# Patient Record
Sex: Male | Born: 1960 | Race: White | Marital: Married | Smoking: Never smoker
Health system: Northeastern US, Academic
[De-identification: ages and names within clinical notes are randomized; demographics above are authoritative.]

## PROBLEM LIST (undated history)

## (undated) HISTORY — PX: VASECTOMY: SHX75

## (undated) HISTORY — PX: HX TONSILLECTOMY/ADENOIDECTOMY: SHX292

---

## 2009-02-06 ENCOUNTER — Ambulatory Visit
Admit: 2009-02-06 | Discharge: 2009-02-06 | Disposition: A | Discharge status: Home / Self Care | Payer: Self-pay | Source: Ambulatory Visit | Attending: Primary Care | Admitting: Primary Care

## 2009-02-06 LAB — COMPREHENSIVE METABOLIC PANEL
ALT: 29 U/L (ref 0–50)
AST: 42 U/L (ref 0–50)
Albumin: 4.8 g/dL (ref 3.5–5.2)
Alk Phos: 67 U/L (ref 40–130)
Anion Gap: 9 (ref 7–16)
Bilirubin,Total: 0.4 mg/dL (ref 0.0–1.2)
CO2: 30 mmol/L — ABNORMAL HIGH (ref 20–28)
Calcium: 9.7 mg/dL (ref 8.6–10.2)
Chloride: 101 mmol/L (ref 96–108)
Creatinine: 0.93 mg/dL (ref 0.67–1.17)
GFR,Black: >59 *
GFR,Caucasian: >59 *
Glucose: 87 mg/dL (ref 74–106)
Lab: 17 mg/dL (ref 6–20)
Potassium: 4.3 mmol/L (ref 3.3–5.1)
Sodium: 140 mmol/L (ref 133–145)
Total Protein: 7.4 g/dL (ref 6.3–7.7)

## 2009-02-06 LAB — URINALYSIS WITH REFLEX TO MICROSCOPIC
Blood,UA: NEGATIVE
Ketones, UA: NEGATIVE
Leuk Esterase,UA: NEGATIVE
Nitrite,UA: NEGATIVE
Protein,UA: NEGATIVE mg/dL
Specific Gravity,UA: 1.01 (ref 1.002–1.030)
pH,UA: 7 (ref 5.0–8.0)

## 2009-02-06 LAB — CBC
Hematocrit: 47 % (ref 40–51)
Hemoglobin: 15.4 g/dL (ref 13.7–17.5)
MCV: 86 fL (ref 79–92)
Platelets: 228 THOU/uL (ref 150–330)
RBC: 5.5 MIL/uL (ref 4.6–6.1)
RDW: 13.3 % (ref 11.6–14.4)
WBC: 7 THOU/uL (ref 4.2–9.1)

## 2009-02-06 LAB — PSA: PSA (eff. 4-2010): 0.86 ng/mL (ref 0.00–4.00)

## 2009-02-06 LAB — LIPID PANEL
Chol/HDL Ratio: 3.7
Cholesterol: 256 mg/dL — AB
HDL: 70 mg/dL
LDL Calculated: 165 mg/dL
Non HDL Cholesterol: 186 mg/dL
Triglycerides: 107 mg/dL

## 2009-02-13 ENCOUNTER — Ambulatory Visit: Payer: Self-pay | Admitting: Primary Care

## 2009-02-13 ENCOUNTER — Encounter: Payer: Self-pay | Admitting: Gastroenterology

## 2009-02-13 NOTE — H&P (Signed)
 Reason For Visit   His only complaint are some right foot lesions.     Medical history remarkable for tonsillectomy vasectomy dysplastic nevi     Family history negative for colon and prostate cancer.  One grandparent had   diabetes.  Father died of suicide at age 51.  Brother has cerebral palsy   and hypertension.  No family history of colon polyps or aneurysm.  Mother's   sister had brain cancer.  Some skin cancers in a [place] relative.    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  Married, 2 children ages 71   and 67, never smoked, 2-3 alcoholic beverages per week.  Normally exercises   but has been on hiatus last 6 months.  Diet higher fat the past one to 2   weeks associated with holidays and son being home.     Medications none     Review of systems unremarkable     Blood pressure 128/84.  HEENT exam is benign.  Neck and thyroid normal.    Chest clear to PandA.  Heart regular without murmur.  Abdomen is benign.    Genital exam is normal.  Rectal exam shows a slightly enlarged prostate   right lobe larger than left which was also noted in 2006.  He has no   lymphadenopathy or edema.  He has a few dysplastic nevi on his back   principally which are dysplastic primarily because of size.  The 2 largest   ones on his back are nearly circular and the smallest of the 3 ovoid. has   some plantar warts on the right foot.  Musculoskeletal and neurologic exams   are normal.     EKG shows sinus rhythm.     Impression-  1. dysplastic nevi.  Again suggested taking a photograph for comparison   purposes  2.  Hyperlipidemia.  Lipids worse  than they were last time.  I recommended   consuming animal fat at only one meal per day and aerobic exercise 90-150   minutes per week.  Will have lipid profile after 3-6 months at his   discretion.  3.  Health maintenance.  Referred for colonoscopy Dr. Lucienne Minks.  Immunizations   up-to-date.  Had flu vaccine at work.   Blood sugar and PSA are normal.  4. plantar warts.  Discussed options.  asymptomatic, may observe.  Allergies   No Known Drug Allergy.  PSH   Surgery Of Male Genitalia Vasectomy (V25.2)  Tonsillectomy.  Immunizations   Td; 25 Jul 2003  Tdap (Adacel); 05 Jun 2006.  Vital Signs   Recorded by Neita Carp on 13 Feb 2009 07:37 AM  HR: 77 b/min  Recorded by [facility] on 13 Feb 2009 07:33 AM  BP:124/86,  RUE,  Sitting,   Height: 68 in, Weight: 178 lb, BMI: 27.1 kg/m2.  Signature   Electronically signed by: Aris Georgia  M.D.; 02/13/2009 8:40 AM EST.

## 2010-02-12 NOTE — Miscellaneous (Unsigned)
 Continuity of Care Record  Created: todo  From: Aris Georgia  From:   From: [employer], EHR v10.2.7.53  To: Rogus, Ajay  Purpose: Patient Use;       Alerts  Allergy - No Known Drug Allergy     Immunizations  Td   Tdap (Adacel)   Influenza

## 2011-03-20 ENCOUNTER — Encounter: Payer: Self-pay | Admitting: Primary Care

## 2011-03-20 ENCOUNTER — Ambulatory Visit: Payer: Self-pay | Admitting: Primary Care

## 2011-03-20 VITALS — BP 126/82 | Resp 14 | Ht 68.0 in | Wt 181.0 lb

## 2011-03-20 DIAGNOSIS — Z139 Encounter for screening, unspecified: Secondary | ICD-10-CM

## 2011-03-20 DIAGNOSIS — L989 Disorder of the skin and subcutaneous tissue, unspecified: Secondary | ICD-10-CM

## 2011-03-20 NOTE — Progress Notes (Signed)
For a couple of weeks he's had some bumps on his back and some itching to a small degree.  He has no pain.  On exam he has numerous benign-appearing lesions on his back including nevi seborrheic keratoses lentigines.    Impression  1. Skin lesions all benign   2. Xerodermatosis Causing itching. Discussed using lubrication  3.  Health maintenance.  Schedule colonoscopy [facility] this year.  Check fasting CMP and lipids sometime

## 2011-03-20 NOTE — Addendum Note (Signed)
Addended by: Aris Georgia B on: 03/20/2011 05:56 PM     Modules accepted: Orders

## 2011-09-23 ENCOUNTER — Encounter: Payer: Self-pay | Admitting: Gastroenterology

## 2011-09-23 LAB — HM COLONOSCOPY

## 2011-10-03 ENCOUNTER — Encounter: Payer: Self-pay | Admitting: Primary Care

## 2011-10-09 ENCOUNTER — Ambulatory Visit
Admit: 2011-10-09 | Discharge: 2011-10-09 | Disposition: A | Discharge status: Home / Self Care | Payer: Self-pay | Source: Ambulatory Visit | Attending: Primary Care | Admitting: Primary Care

## 2011-10-09 DIAGNOSIS — Z139 Encounter for screening, unspecified: Secondary | ICD-10-CM

## 2011-10-09 LAB — LIPID PANEL
Chol/HDL Ratio: 4.5
Cholesterol: 246 mg/dL — AB
HDL: 55 mg/dL
LDL Calculated: 170 mg/dL
Non HDL Cholesterol: 191 mg/dL
Triglycerides: 103 mg/dL

## 2011-10-09 LAB — COMPREHENSIVE METABOLIC PANEL
ALT: 42 U/L (ref 0–50)
AST: 38 U/L (ref 0–50)
Albumin: 4.7 g/dL (ref 3.5–5.2)
Alk Phos: 73 U/L (ref 40–130)
Anion Gap: 11 (ref 7–16)
Bilirubin,Total: 0.3 mg/dL (ref 0.0–1.2)
CO2: 27 mmol/L (ref 20–28)
Calcium: 9.3 mg/dL (ref 8.6–10.2)
Chloride: 104 mmol/L (ref 96–108)
Creatinine: 1.31 mg/dL — ABNORMAL HIGH (ref 0.67–1.17)
GFR,Black: 71 *
GFR,Caucasian: 62 *
Glucose: 102 mg/dL — ABNORMAL HIGH (ref 60–99)
Lab: 20 mg/dL (ref 6–20)
Potassium: 4.2 mmol/L (ref 3.3–5.1)
Sodium: 142 mmol/L (ref 133–145)
Total Protein: 7 g/dL (ref 6.3–7.7)

## 2011-10-11 ENCOUNTER — Encounter: Payer: Self-pay | Admitting: Primary Care

## 2011-10-11 ENCOUNTER — Other Ambulatory Visit: Payer: Self-pay | Admitting: Primary Care

## 2011-10-11 DIAGNOSIS — R7989 Other specified abnormal findings of blood chemistry: Secondary | ICD-10-CM

## 2011-10-11 DIAGNOSIS — E785 Hyperlipidemia, unspecified: Secondary | ICD-10-CM

## 2011-10-17 ENCOUNTER — Telehealth: Payer: Self-pay | Admitting: Primary Care

## 2011-10-17 NOTE — Telephone Encounter (Signed)
Sometime in the next month.  Order for blood and urine at the laboratory already

## 2011-10-17 NOTE — Telephone Encounter (Signed)
Patient received his lab test results - wants to know when you wanted his creatine checked and if lab slip has been put in the system.

## 2011-10-17 NOTE — Telephone Encounter (Signed)
Message left as per Dr. Vukman

## 2012-02-24 ENCOUNTER — Ambulatory Visit
Admit: 2012-02-24 | Discharge: 2012-02-24 | Disposition: A | Discharge status: Home / Self Care | Payer: Self-pay | Source: Ambulatory Visit | Attending: Primary Care | Admitting: Primary Care

## 2012-02-24 DIAGNOSIS — R7989 Other specified abnormal findings of blood chemistry: Secondary | ICD-10-CM

## 2012-02-24 LAB — BASIC METABOLIC PANEL
Anion Gap: 9 (ref 7–16)
CO2: 29 mmol/L — ABNORMAL HIGH (ref 20–28)
Calcium: 9 mg/dL (ref 8.6–10.2)
Chloride: 102 mmol/L (ref 96–108)
Creatinine: 1.08 mg/dL (ref 0.67–1.17)
GFR,Black: 90 *
GFR,Caucasian: 78 *
Glucose: 101 mg/dL — ABNORMAL HIGH (ref 60–99)
Lab: 14 mg/dL (ref 6–20)
Potassium: 4.2 mmol/L (ref 3.3–5.1)
Sodium: 140 mmol/L (ref 133–145)

## 2012-02-24 LAB — URINALYSIS WITH REFLEX TO MICROSCOPIC
Blood,UA: NEGATIVE
Ketones, UA: NEGATIVE
Leuk Esterase,UA: NEGATIVE
Nitrite,UA: NEGATIVE
Protein,UA: NEGATIVE mg/dL
Specific Gravity,UA: 1.024 (ref 1.002–1.030)
pH,UA: 5 (ref 5.0–8.0)

## 2012-02-24 LAB — TSH: TSH: 1.91 u[IU]/mL (ref 0.27–4.20)

## 2012-02-28 ENCOUNTER — Encounter: Payer: Self-pay | Admitting: Primary Care

## 2013-11-16 ENCOUNTER — Ambulatory Visit
Admit: 2013-11-16 | Discharge: 2013-11-16 | Disposition: A | Discharge status: Home / Self Care | Payer: Self-pay | Source: Ambulatory Visit | Attending: Primary Care | Admitting: Primary Care

## 2013-11-16 ENCOUNTER — Telehealth: Payer: Self-pay | Admitting: Primary Care

## 2013-11-16 DIAGNOSIS — Z139 Encounter for screening, unspecified: Secondary | ICD-10-CM

## 2013-11-16 LAB — URINALYSIS WITH REFLEX TO MICROSCOPIC
Blood,UA: NEGATIVE
Ketones, UA: NEGATIVE
Leuk Esterase,UA: NEGATIVE
Nitrite,UA: NEGATIVE
Protein,UA: NEGATIVE mg/dL
Specific Gravity,UA: 1.02 (ref 1.002–1.030)
pH,UA: 6 (ref 5.0–8.0)

## 2013-11-16 LAB — COMPREHENSIVE METABOLIC PANEL
ALT: 34 U/L (ref 0–50)
AST: 34 U/L (ref 0–50)
Albumin: 4.4 g/dL (ref 3.5–5.2)
Alk Phos: 64 U/L (ref 40–130)
Anion Gap: 14 (ref 7–16)
Bilirubin,Total: 0.4 mg/dL (ref 0.0–1.2)
CO2: 26 mmol/L (ref 20–28)
Calcium: 9.3 mg/dL (ref 8.6–10.2)
Chloride: 101 mmol/L (ref 96–108)
Creatinine: 1.11 mg/dL (ref 0.67–1.17)
GFR,Black: 86 *
GFR,Caucasian: 74 *
Glucose: 108 mg/dL — ABNORMAL HIGH (ref 60–99)
Lab: 16 mg/dL (ref 6–20)
Potassium: 4.7 mmol/L (ref 3.3–5.1)
Sodium: 141 mmol/L (ref 133–145)
Total Protein: 6.9 g/dL (ref 6.3–7.7)

## 2013-11-16 LAB — CBC
Hematocrit: 46 % (ref 40–51)
Hemoglobin: 14.6 g/dL (ref 13.7–17.5)
MCH: 27 pg/cell (ref 26–32)
MCHC: 32 g/dL (ref 32–37)
MCV: 86 fL (ref 79–92)
Platelets: 218 10*3/uL (ref 150–330)
RBC: 5.4 MIL/uL (ref 4.6–6.1)
RDW: 14.6 % — ABNORMAL HIGH (ref 11.6–14.4)
WBC: 6.1 10*3/uL (ref 4.2–9.1)

## 2013-11-16 LAB — LIPID PANEL
Chol/HDL Ratio: 3.5
Cholesterol: 234 mg/dL — AB
HDL: 67 mg/dL
LDL Calculated: 152 mg/dL
Non HDL Cholesterol: 167 mg/dL
Triglycerides: 73 mg/dL

## 2013-11-16 NOTE — Telephone Encounter (Signed)
Patient is currently at the lab, has appointment on 11/22/13

## 2013-11-22 ENCOUNTER — Ambulatory Visit: Payer: Self-pay | Admitting: Primary Care

## 2013-11-22 ENCOUNTER — Encounter: Payer: Self-pay | Admitting: Primary Care

## 2013-11-22 VITALS — BP 134/72 | HR 80 | Ht 68.0 in | Wt 186.0 lb

## 2013-11-22 DIAGNOSIS — Z0000 Encounter for general adult medical examination without abnormal findings: Secondary | ICD-10-CM

## 2013-11-22 NOTE — Progress Notes (Signed)
He had some intermittent dizziness described as swaying being off balance on and off during 1 day.  No nausea palpitation tinnitus hearing change       Medical history remarkable for tonsillectomy vasectomy dysplastic nevi    Family history reviewed negative for colon and prostate cancer. One grandparent had diabetes. Father died of suicide at age 139. Brother has cerebral palsy and hypertension. No family history of colon polyps or aneurysm. Mother's sister had brain cancer. [place] skin cancers in [place]. 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  Walking regularly 3-4 times per week but no aerobic exercise     Medications none    Review of systems unremarkable    BP 134/72 mmHg   Pulse 80   Ht 1.727 m (5\' 8" )   Wt 84.369 kg (186 lb)   BMI 28.29 kg/m2  HEENT exam is normal.  Neck and thyroid normal.  Chest clear to P&A.  Heart regular without murmur.  Abdomen without tenderness mass organomegaly.  Genital exam is normal.  Circumcised.  No hernia.  Rectal exam shows normal size prostate without induration or nodularity.  He has some dysplastic nevi seborrheic keratoses.  He has no lymphadenopathy or edema.  Neurologic musculoskeletal exams are normal.  Pedal pulses 2+.  Romberg negative.  Heel toe tandem gait done very well.      Impression-  1. dizziness, most likely labyrinthine in origin.  Discussed.  Observe.    2. Hyperlipidemia.  Discussed, 10 year Framingham risk 7%.  Offered statin, however, 150 minutes of aerobic exercise weekly would do just as well.  3. Health maintenance.  Blood sugar normal.  Given flu vaccine.

## 2015-06-14 ENCOUNTER — Encounter: Payer: Self-pay | Admitting: Primary Care

## 2015-06-14 ENCOUNTER — Ambulatory Visit: Payer: Self-pay | Admitting: Primary Care

## 2015-06-14 VITALS — BP 142/80 | HR 74 | Ht 68.0 in | Wt 185.7 lb

## 2015-06-14 DIAGNOSIS — M722 Plantar fascial fibromatosis: Secondary | ICD-10-CM

## 2015-06-14 DIAGNOSIS — Z0000 Encounter for general adult medical examination without abnormal findings: Secondary | ICD-10-CM

## 2015-06-14 MED ORDER — PREDNISONE 20 MG PO TABS *I*
20.0000 mg | ORAL_TABLET | Freq: Every day | ORAL | 0 refills | Status: DC
Start: 2015-06-14 — End: 2015-11-06

## 2015-06-14 NOTE — Progress Notes (Signed)
He complains of 6 weeks of right heel pain without injury overuse unresponsive to ibuprofen worse with weightbearing without numbness paresthesias.  Visit Vitals    BP 142/80 (BP Location: Right arm, Patient Position: Sitting, Cuff Size: large adult)    Pulse 74    Ht 1.727 m (5\' 8" )    Wt 84.2 kg (185 lb 11.2 oz)    SpO2 98%    BMI 28.24 kg/m2     On exam passive ranging of his ankle and subtalar joints is normal as are isometrics.  He has no Achilles tenderness.  He does have calcaneal tenderness.    Impression plantar fasciitis.  Recommend prednisone 20 mg daily for no more than 2 weeks, stretches, given written instructions, Tulli's heel cup.  If no resolution call for referral    Recheck later this year physical laboratory

## 2015-10-30 ENCOUNTER — Other Ambulatory Visit
Admission: RE | Admit: 2015-10-30 | Discharge: 2015-10-30 | Disposition: A | Discharge status: Home / Self Care | Payer: Self-pay | Source: Ambulatory Visit | Attending: Primary Care | Admitting: Primary Care

## 2015-10-30 DIAGNOSIS — Z0000 Encounter for general adult medical examination without abnormal findings: Secondary | ICD-10-CM

## 2015-10-30 LAB — LIPID PANEL
Chol/HDL Ratio: 4
Cholesterol: 232 mg/dL — AB
HDL: 58 mg/dL
LDL Calculated: 146 mg/dL
Non HDL Cholesterol: 174 mg/dL
Triglycerides: 138 mg/dL

## 2015-10-30 LAB — COMPREHENSIVE METABOLIC PANEL
ALT: 33 U/L (ref 0–50)
AST: 39 U/L (ref 0–50)
Albumin: 4.4 g/dL (ref 3.5–5.2)
Alk Phos: 62 U/L (ref 40–130)
Anion Gap: 13 (ref 7–16)
Bilirubin,Total: 0.6 mg/dL (ref 0.0–1.2)
CO2: 29 mmol/L — ABNORMAL HIGH (ref 20–28)
Calcium: 9.3 mg/dL (ref 8.6–10.2)
Chloride: 100 mmol/L (ref 96–108)
Creatinine: 1.12 mg/dL (ref 0.67–1.17)
GFR,Black: 84 *
GFR,Caucasian: 72 *
Glucose: 100 mg/dL — ABNORMAL HIGH (ref 60–99)
Lab: 17 mg/dL (ref 6–20)
Potassium: 4.1 mmol/L (ref 3.3–5.1)
Sodium: 142 mmol/L (ref 133–145)
Total Protein: 6.6 g/dL (ref 6.3–7.7)

## 2015-10-30 LAB — URINALYSIS WITH REFLEX TO MICROSCOPIC
Blood,UA: NEGATIVE
Ketones, UA: NEGATIVE
Leuk Esterase,UA: NEGATIVE
Nitrite,UA: NEGATIVE
Protein,UA: NEGATIVE mg/dL
Specific Gravity,UA: 1.017 (ref 1.002–1.030)
pH,UA: 7 (ref 5.0–8.0)

## 2015-10-30 LAB — CBC
Hematocrit: 45 % (ref 40–51)
Hemoglobin: 15.1 g/dL (ref 13.7–17.5)
MCH: 28 pg/cell (ref 26–32)
MCHC: 33 g/dL (ref 32–37)
MCV: 84 fL (ref 79–92)
Platelets: 199 10*3/uL (ref 150–330)
RBC: 5.4 MIL/uL (ref 4.6–6.1)
RDW: 12.8 % (ref 11.6–14.4)
WBC: 5.1 10*3/uL (ref 4.2–9.1)

## 2015-10-30 LAB — HEMOGLOBIN A1C: Hemoglobin A1C: 5.9 % (ref 4.0–6.0)

## 2015-11-06 ENCOUNTER — Ambulatory Visit: Payer: Self-pay | Admitting: Primary Care

## 2015-11-06 ENCOUNTER — Encounter: Payer: Self-pay | Admitting: Primary Care

## 2015-11-06 VITALS — BP 140/86 | HR 78 | Ht 68.0 in | Wt 187.9 lb

## 2015-11-06 DIAGNOSIS — Z0000 Encounter for general adult medical examination without abnormal findings: Secondary | ICD-10-CM

## 2015-11-06 DIAGNOSIS — Z23 Encounter for immunization: Secondary | ICD-10-CM

## 2015-11-06 LAB — HM HIV SCREENING OFFERED

## 2015-11-06 LAB — PCMH DEPRESSION ASSESSMENT

## 2015-11-06 NOTE — Progress Notes (Signed)
His plantar fasciitis is much better but not completely resolved.  Doing stretches.  Symptoms primarily limited to the morning.     Medical history remarkable for tonsillectomy vasectomy dysplastic nevi    Family history reviewed negative for colon and prostate cancer. One grandparent had diabetes. Father died of suicide at age 57. Brother has cerebral palsy and hypertension. No family history of colon polyps or aneurysm. Mother's sister had brain cancer. Some skin cancers in [place]. 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 Married, 2 children grown, never smoked, 4 alcoholic beverages per week.  Walking vigorously 40 minutes 4-5 times per week     Medications none    Review of systems unremarkable, depression screening negative. phq2 score is 0    BP 140/86   Pulse 78   Ht 1.727 m (5\' 8" )   Wt 85.2 kg (187 lb 14.4 oz)   BMI 28.57 kg/m2  blood pressure 134/74    HEENT exam is normal.  Neck and thyroid normal.  Chest clear to P&A.  Heart regular without murmur.  Abdomen without tenderness mass organomegaly.  Genital exam is normal.  Circumcised.  No hernia.  Rectal exam shows normal size prostate without induration or nodularity.  He has some dysplastic nevi and seborrheic keratoses.  He has no lymphadenopathy or edema.  Neurologic and musculoskeletal exams are normal.  Pedal pulses 2+.      Impression-  Dysplastic nevi, suggested taking pictures of three largest on back for visual reference    Hyperlipidemia, mild, medication not indicated.  Continue exercise  Health maintenance.   immunizations up-to-date.  Blood sugar normal.  Tdap and colonoscopy due 2018.  Has given blood multiple times in the past several years with [facility] therefore does not have HIV or hepatitis C  Plantar fasciitis    Recheck 1-2 years

## 2016-04-28 ENCOUNTER — Ambulatory Visit: Payer: BLUE CROSS/BLUE SHIELD | Attending: Primary Care | Admitting: Primary Care

## 2016-04-28 ENCOUNTER — Encounter: Payer: Self-pay | Admitting: Primary Care

## 2016-04-28 VITALS — BP 128/70 | HR 82 | Ht 69.0 in | Wt 187.0 lb

## 2016-04-28 DIAGNOSIS — L0390 Cellulitis, unspecified: Secondary | ICD-10-CM

## 2016-04-28 MED ORDER — CEPHALEXIN 250 MG PO CAPS *I*
250.0000 mg | ORAL_CAPSULE | Freq: Four times a day (QID) | ORAL | 0 refills | Status: DC
Start: 2016-04-28 — End: 2016-04-28

## 2016-04-28 MED ORDER — CEPHALEXIN 250 MG PO CAPS *I*
250.0000 mg | ORAL_CAPSULE | Freq: Four times a day (QID) | ORAL | 0 refills | Status: DC
Start: 2016-04-28 — End: 2016-07-23

## 2016-04-28 NOTE — Progress Notes (Signed)
He complains of some left eye swelling and discomfort.  Began yesterday.  No fever.  Denies itching.  Denies discharge.  BP 128/70   Pulse 82   Ht 1.753 m (5\' 9" )   Wt 84.8 kg (187 lb)   SpO2 98%   BMI 27.62 kg/m2  On exam he has no conjunctival injection.  Margin of his left upper eyelid is edematous and mildly red.  There is no evidence of stye.  He has no conjunctival discharge.    Impression cellulitis.  Scribed Keflex.

## 2016-07-23 ENCOUNTER — Encounter: Payer: Self-pay | Admitting: Primary Care

## 2016-07-23 ENCOUNTER — Ambulatory Visit: Payer: BLUE CROSS/BLUE SHIELD | Attending: Primary Care | Admitting: Primary Care

## 2016-07-23 VITALS — BP 136/82 | HR 84 | Temp 99.4°F | Ht 69.0 in | Wt 188.0 lb

## 2016-07-23 DIAGNOSIS — J029 Acute pharyngitis, unspecified: Secondary | ICD-10-CM

## 2016-07-23 MED ORDER — AZITHROMYCIN 250 MG PO TABS *I*
ORAL_TABLET | ORAL | 0 refills | Status: AC
Start: 2016-07-23 — End: 2016-07-28

## 2016-07-26 ENCOUNTER — Encounter: Payer: Self-pay | Admitting: Primary Care

## 2016-07-26 NOTE — Progress Notes (Signed)
Patient Dr. Sharon MtVukman presents with a worsening sore throat over the past several days, associated with some bilateral ear fullness.  Is not describing much in the way of cough.  No dyspnea is noted.  Is not describing any constitutional symptoms such as fevers or chills.  No GI symptoms such as diarrhea nor nausea are noted.    Physical exam-BP was 166/88 initially, 136/82 on recheck, temperature 99.4 degrees, looked well, ears were normal, throat was reddened bilaterally, neck was without adenopathy and lungs were clear, cardiac exam was regular with no murmurs.    Assessment/plan    Pharyngitis-appears bacterial, started on azithromycin with the generic Z-Pak.

## 2016-08-01 ENCOUNTER — Telehealth: Payer: Self-pay | Admitting: Primary Care

## 2016-08-01 MED ORDER — AMOXICILLIN-POT CLAVULANATE 875-125 MG PO TABS *I*
ORAL_TABLET | ORAL | 0 refills | Status: DC
Start: 2016-08-01 — End: 2017-11-13

## 2016-08-01 NOTE — Telephone Encounter (Signed)
Patient states that he saw Dr. Hetty BlendBuckley on 07/23/16. States Dr. Hetty BlendBuckley prescribed him azithromycin (ZITHROMAX) 250 MG tablet and medication did not work. Patient requesting another medication. Please advise.

## 2016-08-01 NOTE — Telephone Encounter (Signed)
Patient did not fully clear with a azithromycin, will try alternative with Augmentin 875 twice daily for 7 days.

## 2016-08-01 NOTE — Telephone Encounter (Addendum)
Patient was seen on 07/23/16 for pharyngitis, prescribed Z-Pak. Sore throat gone and cough improved, but cough returned. Cough is mostly bothersome in the AM, coughing up green-yellowish phlegm. Also has ear congestion, popping and nose is running.  No n/v, no diarrhea/constipation, no fever/chills  Asking for a refill on antibiotic or a different antibiotic.

## 2017-01-09 ENCOUNTER — Encounter: Payer: Self-pay | Admitting: Primary Care

## 2017-01-09 ENCOUNTER — Encounter: Payer: Self-pay | Admitting: Gastroenterology

## 2017-01-09 LAB — HM COLONOSCOPY

## 2017-07-28 ENCOUNTER — Other Ambulatory Visit: Payer: Self-pay | Admitting: Primary Care

## 2017-07-28 ENCOUNTER — Encounter: Payer: Self-pay | Admitting: Primary Care

## 2017-07-28 ENCOUNTER — Other Ambulatory Visit
Admission: RE | Admit: 2017-07-28 | Discharge: 2017-07-28 | Disposition: A | Discharge status: Home / Self Care | Payer: BLUE CROSS/BLUE SHIELD | Source: Ambulatory Visit | Attending: Primary Care | Admitting: Primary Care

## 2017-07-28 ENCOUNTER — Ambulatory Visit: Payer: BLUE CROSS/BLUE SHIELD | Attending: Primary Care | Admitting: Primary Care

## 2017-07-28 VITALS — BP 120/84 | HR 78 | Ht 69.0 in | Wt 192.1 lb

## 2017-07-28 DIAGNOSIS — M109 Gout, unspecified: Secondary | ICD-10-CM

## 2017-07-28 LAB — URIC ACID: Urate: 7.5 mg/dL (ref 3.9–9.0)

## 2017-07-28 MED ORDER — PREDNISONE 20 MG PO TABS *I*
20.0000 mg | ORAL_TABLET | Freq: Every day | ORAL | 0 refills | Status: DC
Start: 2017-07-28 — End: 2017-07-28

## 2017-07-28 MED ORDER — COLCHICINE-PROBENECID 0.5-500 MG PO TABS *A*
ORAL_TABLET | ORAL | 0 refills | Status: DC
Start: 2017-07-28 — End: 2017-11-13

## 2017-07-28 MED ORDER — PREDNISONE 20 MG PO TABS *I*
20.0000 mg | ORAL_TABLET | Freq: Every day | ORAL | 0 refills | Status: DC
Start: 2017-07-28 — End: 2017-11-13

## 2017-07-28 MED ORDER — COLCHICINE-PROBENECID 0.5-500 MG PO TABS *A*
ORAL_TABLET | ORAL | 0 refills | Status: DC
Start: 2017-07-28 — End: 2017-07-28

## 2017-07-28 NOTE — Progress Notes (Signed)
He developed pain in his right foot 2 days ago in the wake of having 2 maybe 3 beers over the weekend.  Aleve PM helps some.  Had similar episode some years ago.  No family history of gout.  No fever.  On exam passive ranging of his right first MTP joint causes pain.  There is some swelling and tenderness and redness over the joint and the adjacent dorsum of the foot.  The erythema over the dorsal foot is not tender to light palpation.    Impression right foot pain most likely due to gout.  Check uric acid level.  He will try taking colchicine and after 8-10 hours if not substantially better may try some prednisone.  He may still use some over-the-counter naproxen or ibuprofen for pain relief if necessary

## 2017-07-28 NOTE — Telephone Encounter (Signed)
Prescriptions were originally sent to [facility].  One of the scripts medication was unavailable.   [facility] [address]

## 2017-07-28 NOTE — Telephone Encounter (Signed)
Orders pended, pharmacy changed.  Medications at [facility], cancelled.

## 2017-07-29 ENCOUNTER — Encounter: Payer: Self-pay | Admitting: Primary Care

## 2017-11-13 ENCOUNTER — Ambulatory Visit: Payer: BLUE CROSS/BLUE SHIELD | Attending: Primary Care | Admitting: Primary Care

## 2017-11-13 ENCOUNTER — Encounter: Payer: Self-pay | Admitting: Primary Care

## 2017-11-13 VITALS — BP 136/68 | HR 96 | Temp 102.4°F | Ht 69.0 in | Wt 192.0 lb

## 2017-11-13 DIAGNOSIS — B349 Viral infection, unspecified: Secondary | ICD-10-CM

## 2017-11-13 LAB — PCMH DEPRESSION ASSESSMENT

## 2017-11-14 ENCOUNTER — Encounter: Payer: Self-pay | Admitting: Primary Care

## 2017-11-14 NOTE — Progress Notes (Signed)
Patient of Dr. Sharon Mt presents with 2 days of fevers chills with body aches, not describing cough or sputum production, no earache sore throat with sinus pressure.  He had some mild nausea but no vomiting, no diarrhea.  No abdominal cramps noted.  No urinary symptoms are noted.  He had received influenza vaccine the day prior to the onset of symptoms.    Physical exam-BP was 136/68, pulse of 76 and regular, temperature 102.4 degrees, he looked well, ears were normal, throat was clear, neck was without adenopathy and lungs were clear, abdomen was soft and nontender with no organomegaly normal bowel sounds and no masses.    Assessment/plan    Viral syndrome-likely explanation for this constellation symptoms, advised ibuprofen or acetaminophen as needed.  I do not feel is related to influenza vaccine.

## 2018-08-19 ENCOUNTER — Other Ambulatory Visit: Payer: Self-pay | Admitting: Primary Care

## 2018-08-19 MED ORDER — PREDNISONE 20 MG PO TABS *I*
20.0000 mg | ORAL_TABLET | Freq: Every day | ORAL | 0 refills | Status: DC
Start: 2018-08-19 — End: 2019-07-07

## 2018-08-19 MED ORDER — COLCHICINE-PROBENECID 0.5-500 MG PO TABS *A*
ORAL_TABLET | ORAL | 0 refills | Status: DC
Start: 2018-08-19 — End: 2019-07-07

## 2018-08-19 NOTE — Telephone Encounter (Signed)
He was prescribed these 2 medications  Last year for gout  He had some refills that he used when he had flare ups  He would like these rx's renewed as he is having a flare up   He would like to be notified   Pharmacy is [facility] in [place]

## 2018-12-23 ENCOUNTER — Ambulatory Visit: Payer: BLUE CROSS/BLUE SHIELD | Admitting: Primary Care

## 2018-12-23 ENCOUNTER — Encounter: Payer: Self-pay | Admitting: Primary Care

## 2018-12-23 ENCOUNTER — Other Ambulatory Visit
Admission: RE | Admit: 2018-12-23 | Discharge: 2018-12-23 | Disposition: A | Discharge status: Home / Self Care | Payer: BLUE CROSS/BLUE SHIELD | Source: Ambulatory Visit | Attending: Primary Care | Admitting: Primary Care

## 2018-12-23 VITALS — BP 142/88 | HR 88 | Ht 69.0 in | Wt 189.2 lb

## 2018-12-23 DIAGNOSIS — R1011 Right upper quadrant pain: Secondary | ICD-10-CM

## 2018-12-23 LAB — CBC
Hematocrit: 46 % (ref 40–51)
Hemoglobin: 14.9 g/dL (ref 13.7–17.5)
MCH: 28 pg/cell (ref 26–32)
MCHC: 32 g/dL (ref 32–37)
MCV: 86 fL (ref 79–92)
Platelets: 206 10*3/uL (ref 150–330)
RBC: 5.4 MIL/uL (ref 4.6–6.1)
RDW: 12.7 % (ref 11.6–14.4)
WBC: 7.2 10*3/uL (ref 4.2–9.1)

## 2018-12-23 LAB — URINALYSIS WITH REFLEX TO MICROSCOPIC
Glucose,UA: NEGATIVE mg/dL
Leuk Esterase,UA: NEGATIVE
Nitrite,UA: NEGATIVE
Specific Gravity,UA: 1.02 (ref 1.002–1.030)
pH,UA: 7 (ref 5.0–8.0)

## 2018-12-23 LAB — URINE MICROSCOPIC (IQ200)
Bacteria,UA: NONE SEEN
Hyaline Casts,UA: NONE SEEN /lpf (ref 0–5)
RBC,UA: >50 /hpf — AB (ref 0–3)

## 2018-12-23 LAB — PCMH DEPRESSION ASSESSMENT

## 2018-12-23 LAB — LIPASE: Lipase: 53 U/L (ref 13–60)

## 2018-12-23 LAB — AMYLASE: Amylase: 67 U/L (ref 28–100)

## 2018-12-23 NOTE — Progress Notes (Signed)
Complains of right upper quadrant pain that began 3 days ago.  Sometimes pain is felt posteriorly as well but not in right shoulder.  Not continuous, episodes are provoked by eating typically 45-60 minutes later.  Severity such that it made it difficult to sleep at times.  Admits to mild nausea, denies emesis.  Has some sign of some chills but no sweats.  Feels washed out.  Bowels are looser.  Has no urinary symptoms.  Urination does not aggravate pain.  Medications reviewed and listed below  BP 142/88    Pulse 88    Ht 1.753 m (5\' 9" )    Wt 85.8 kg (189 lb 3.2 oz)    BMI 27.94 kg/m   On exam he has no abdominal tenderness mass or organomegaly.  No pallor or jaundice.    Impression  Right upper quadrant pain, suspect cholecystitis.  He will have laboratory work as quickly as possible after his next episode of pain.  Recommended low/no fat diet.  Obtain abdominal ultrasound.     Current Outpatient Medications   Medication Sig    colchicine-probenecid (COLBENEMID) 0.5-500 MG per tablet Take two tablets then one two hours later    predniSONE (DELTASONE) 20 MG tablet Take 1 tablet (20 mg total) by mouth daily

## 2018-12-24 ENCOUNTER — Encounter: Payer: Self-pay | Admitting: Primary Care

## 2018-12-24 LAB — COMPREHENSIVE METABOLIC PANEL
ALT: 29 U/L (ref 0–50)
AST: 29 U/L (ref 0–50)
Albumin: 4.7 g/dL (ref 3.5–5.2)
Alk Phos: 83 U/L (ref 40–130)
Anion Gap: 12 (ref 7–16)
Bilirubin,Total: 0.4 mg/dL (ref 0.0–1.2)
CO2: 27 mmol/L (ref 20–28)
Calcium: 9.3 mg/dL (ref 8.6–10.2)
Chloride: 100 mmol/L (ref 96–108)
Creatinine: 1.2 mg/dL — ABNORMAL HIGH (ref 0.67–1.17)
GFR,Black: 75 *
GFR,Caucasian: 65 *
Glucose: 108 mg/dL — ABNORMAL HIGH (ref 60–99)
Lab: 17 mg/dL (ref 6–20)
Potassium: 4.3 mmol/L (ref 3.3–5.1)
Sodium: 139 mmol/L (ref 133–145)
Total Protein: 6.9 g/dL (ref 6.3–7.7)

## 2018-12-28 ENCOUNTER — Encounter: Payer: Self-pay | Admitting: Primary Care

## 2018-12-28 ENCOUNTER — Ambulatory Visit
Admission: RE | Admit: 2018-12-28 | Discharge: 2018-12-28 | Disposition: A | Discharge status: Home / Self Care | Payer: BLUE CROSS/BLUE SHIELD | Source: Ambulatory Visit | Attending: Primary Care | Admitting: Primary Care

## 2018-12-28 DIAGNOSIS — R1011 Right upper quadrant pain: Secondary | ICD-10-CM | POA: Insufficient documentation

## 2018-12-29 ENCOUNTER — Encounter: Payer: Self-pay | Admitting: Primary Care

## 2018-12-29 ENCOUNTER — Ambulatory Visit: Payer: BLUE CROSS/BLUE SHIELD | Attending: Primary Care | Admitting: Primary Care

## 2018-12-29 VITALS — BP 140/84 | HR 72 | Ht 69.0 in | Wt 186.9 lb

## 2018-12-29 DIAGNOSIS — N23 Unspecified renal colic: Secondary | ICD-10-CM | POA: Insufficient documentation

## 2018-12-29 LAB — URINE MICROSCOPIC (IQ200)
Bacteria,UA: NONE SEEN
Hyaline Casts,UA: NONE SEEN /lpf (ref 0–5)
Squam Epithel,UA: NONE SEEN /lpf (ref 0–?)
WBC,UA: NONE SEEN /hpf (ref 0–5)

## 2018-12-29 LAB — URINALYSIS WITH REFLEX TO MICROSCOPIC
Glucose,UA: NEGATIVE mg/dL
Ketones, UA: NEGATIVE
Leuk Esterase,UA: NEGATIVE
Nitrite,UA: NEGATIVE
Protein,UA: NEGATIVE mg/dL
Specific Gravity,UA: 1.006 (ref 1.002–1.030)
pH,UA: 7 (ref 5.0–8.0)

## 2018-12-29 MED ORDER — TAMSULOSIN HCL 0.4 MG PO CAPS *I*
0.4000 mg | ORAL_CAPSULE | Freq: Every day | ORAL | 5 refills | Status: DC
Start: 2018-12-29 — End: 2019-12-22

## 2018-12-29 NOTE — Progress Notes (Signed)
He returns for follow-up of abdominal pain.  His last episode of really bad pain was 2 days ago.  One episode followed about 3 hours after substantial fat meal.  In the last 2 days he's begun to have discomfort at the base of his penis and an urge to urinate.  Urinalysis done close to the time of a bad episode showed some blood.  Abdominal ultrasound showed some biliary sludge and anechoic right renal lesion.  BP 140/84 (BP Location: Left arm, Patient Position: Sitting, Cuff Size: adult)    Pulse 72    Ht 1.753 m (5\' 9" )    Wt 84.8 kg (186 lb 14.4 oz)    SpO2 100%    BMI 27.60 kg/m   Abdomen nontender.  Urinalysis dipstick shows trace blood.    Impression  Ureteral colic at this point seems more likely than biliary disease which nevertheless is not completely excluded.  He will take tamsulosin.  He will look for a stone when he urinates.  He will have a CT urogram.  Biliary sludge  Right renal lesion, and to confirm cyst.

## 2019-01-02 ENCOUNTER — Encounter: Payer: Self-pay | Admitting: Primary Care

## 2019-01-05 ENCOUNTER — Other Ambulatory Visit
Admission: RE | Admit: 2019-01-05 | Discharge: 2019-01-05 | Disposition: A | Discharge status: Home / Self Care | Payer: BLUE CROSS/BLUE SHIELD | Source: Ambulatory Visit | Attending: Primary Care | Admitting: Primary Care

## 2019-01-05 ENCOUNTER — Other Ambulatory Visit: Payer: Self-pay | Admitting: Primary Care

## 2019-01-05 DIAGNOSIS — N200 Calculus of kidney: Secondary | ICD-10-CM

## 2019-01-08 LAB — STONE ANALYSIS
Calculi Mass: 68 mg
Calculi Number: 1
Stone Weight: 5 mm

## 2019-01-09 ENCOUNTER — Encounter: Payer: Self-pay | Admitting: Primary Care

## 2019-03-30 ENCOUNTER — Other Ambulatory Visit: Payer: Self-pay

## 2019-03-30 MED ORDER — RETIRED - COVID-19 MRNA VACCINE (MODERNA) 100 MCG/0.5ML IM SUSP *I*
INTRAMUSCULAR | 1 refills | Status: DC
Start: 2019-02-16 — End: 2019-12-22
  Filled 2019-03-30: qty 0.5, 1d supply, fill #0
  Filled 2019-04-01: qty 0.5, 1d supply, fill #1

## 2019-04-01 ENCOUNTER — Other Ambulatory Visit: Payer: Self-pay

## 2019-07-05 ENCOUNTER — Encounter: Payer: Self-pay | Admitting: Primary Care

## 2019-07-05 DIAGNOSIS — N289 Disorder of kidney and ureter, unspecified: Secondary | ICD-10-CM

## 2019-07-07 MED ORDER — PREDNISONE 20 MG PO TABS *I*
20.0000 mg | ORAL_TABLET | Freq: Every day | ORAL | 0 refills | Status: DC
Start: 2019-07-07 — End: 2020-05-08

## 2019-07-07 MED ORDER — COLCHICINE-PROBENECID 0.5-500 MG PO TABS *A*
ORAL_TABLET | ORAL | 0 refills | Status: DC
Start: 2019-07-07 — End: 2020-05-08

## 2019-07-07 NOTE — Telephone Encounter (Signed)
Orders and medication pended

## 2019-09-09 ENCOUNTER — Ambulatory Visit
Admission: RE | Admit: 2019-09-09 | Discharge: 2019-09-09 | Disposition: A | Discharge status: Home / Self Care | Payer: BLUE CROSS/BLUE SHIELD | Source: Ambulatory Visit | Admitting: Radiology

## 2019-09-09 DIAGNOSIS — N281 Cyst of kidney, acquired: Secondary | ICD-10-CM

## 2019-09-09 DIAGNOSIS — N400 Enlarged prostate without lower urinary tract symptoms: Secondary | ICD-10-CM

## 2019-09-09 DIAGNOSIS — N289 Disorder of kidney and ureter, unspecified: Secondary | ICD-10-CM

## 2019-09-14 ENCOUNTER — Other Ambulatory Visit: Payer: Self-pay | Admitting: Primary Care

## 2019-09-14 ENCOUNTER — Encounter: Payer: Self-pay | Admitting: Primary Care

## 2019-09-14 DIAGNOSIS — Z0000 Encounter for general adult medical examination without abnormal findings: Secondary | ICD-10-CM

## 2019-12-13 ENCOUNTER — Other Ambulatory Visit
Admission: RE | Admit: 2019-12-13 | Discharge: 2019-12-13 | Disposition: A | Discharge status: Home / Self Care | Payer: BLUE CROSS/BLUE SHIELD | Source: Ambulatory Visit | Attending: Primary Care | Admitting: Primary Care

## 2019-12-13 ENCOUNTER — Other Ambulatory Visit: Payer: Self-pay | Admitting: Gastroenterology

## 2019-12-13 ENCOUNTER — Encounter: Payer: Self-pay | Admitting: Primary Care

## 2019-12-13 DIAGNOSIS — Z0000 Encounter for general adult medical examination without abnormal findings: Secondary | ICD-10-CM | POA: Insufficient documentation

## 2019-12-13 DIAGNOSIS — N200 Calculus of kidney: Secondary | ICD-10-CM | POA: Insufficient documentation

## 2019-12-13 LAB — URINALYSIS WITH REFLEX TO MICROSCOPIC
Blood,UA: NEGATIVE
Glucose,UA: NEGATIVE
Ketones, UA: NEGATIVE
Leuk Esterase,UA: NEGATIVE
Nitrite,UA: NEGATIVE
Protein,UA: NEGATIVE
Specific Gravity,UA: 1.014 (ref 1.002–1.030)
pH,UA: 6.5 (ref 5.0–8.0)

## 2019-12-13 LAB — LIPID PANEL
Chol/HDL Ratio: 4.5
Cholesterol: 271 mg/dL — AB
HDL: 60 mg/dL (ref 40–60)
LDL Calculated: 175 mg/dL — AB
Non HDL Cholesterol: 211 mg/dL
Triglycerides: 180 mg/dL — AB

## 2019-12-13 LAB — COMPREHENSIVE METABOLIC PANEL
ALT: 30 U/L (ref 0–50)
AST: 29 U/L (ref 0–50)
Albumin: 4.5 g/dL (ref 3.5–5.2)
Alk Phos: 77 U/L (ref 40–130)
Anion Gap: 11 (ref 7–16)
Bilirubin,Total: 0.4 mg/dL (ref 0.0–1.2)
CO2: 27 mmol/L (ref 20–28)
Calcium: 9.6 mg/dL (ref 8.6–10.2)
Chloride: 102 mmol/L (ref 96–108)
Creatinine: 1.08 mg/dL (ref 0.67–1.17)
GFR,Black: 85 *
GFR,Caucasian: 74 *
Glucose: 129 mg/dL — ABNORMAL HIGH (ref 60–99)
Lab: 13 mg/dL (ref 6–20)
Potassium: 4.3 mmol/L (ref 3.3–5.1)
Sodium: 140 mmol/L (ref 133–145)
Total Protein: 6.8 g/dL (ref 6.3–7.7)

## 2019-12-13 LAB — CBC
Hematocrit: 46 % (ref 40–51)
Hemoglobin: 15 g/dL (ref 13.7–17.5)
MCH: 28 pg (ref 26–32)
MCHC: 32 g/dL (ref 32–37)
MCV: 85 fL (ref 79–92)
Platelets: 216 10*3/uL (ref 150–330)
RBC: 5.4 MIL/uL (ref 4.6–6.1)
RDW: 13 % (ref 11.6–14.4)
WBC: 6.6 10*3/uL (ref 4.2–9.1)

## 2019-12-13 LAB — HEPATITIS C ANTIBODY: Hep C Ab: NEGATIVE

## 2019-12-13 LAB — HEMOGLOBIN A1C: Hemoglobin A1C: 6.1 % — ABNORMAL HIGH

## 2019-12-13 LAB — PSA (EFF.4-2010): PSA (eff. 4-2010): 2.46 ng/mL (ref 0.00–4.00)

## 2019-12-14 ENCOUNTER — Other Ambulatory Visit: Payer: Self-pay | Admitting: Primary Care

## 2019-12-14 DIAGNOSIS — E785 Hyperlipidemia, unspecified: Secondary | ICD-10-CM

## 2019-12-14 LAB — T4, FREE: Free T4: 1 ng/dL (ref 0.9–1.7)

## 2019-12-14 LAB — TSH: TSH: 1.72 u[IU]/mL (ref 0.27–4.20)

## 2019-12-22 ENCOUNTER — Ambulatory Visit: Payer: BLUE CROSS/BLUE SHIELD | Admitting: Primary Care

## 2019-12-22 ENCOUNTER — Encounter: Payer: Self-pay | Admitting: Primary Care

## 2019-12-22 VITALS — BP 148/82 | HR 89 | Resp 18 | Ht 68.0 in | Wt 191.1 lb

## 2019-12-22 DIAGNOSIS — Z0000 Encounter for general adult medical examination without abnormal findings: Secondary | ICD-10-CM

## 2019-12-22 DIAGNOSIS — E785 Hyperlipidemia, unspecified: Secondary | ICD-10-CM

## 2019-12-22 DIAGNOSIS — Z23 Encounter for immunization: Secondary | ICD-10-CM

## 2019-12-22 NOTE — Progress Notes (Signed)
He has no complaints      Medical history remarkable for tonsillectomy vasectomy dysplastic nevi kidney stone gout    Family history reviewed negative for colon and prostate cancer. One grandparent had diabetes. Father died of suicide at age 61. Brother has cerebral palsy and hypertension. No family history of colon polyps or aneurysm. Mother's sister had brain cancer. Some skin cancers in a [place] relative. 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 Married, 2 children grown, never smoked, 4-5 alcoholic beverages per week. Walking 40 minutes 3 times per week     Medications none regular    Review of systems denies nocturia     BP 148/82    Pulse 89    Resp 18    Ht 1.727 m (5\' 8" )    Wt 86.7 kg (191 lb 1.6 oz)    SpO2 99%    BMI 29.06 kg/m       HEENT exam is normal. Neck and thyroid normal. Chest clear to auscultation  Heart regular without murmur. Abdomen without tenderness mass organomegaly. Genital exam is normal. Circumcised. Rectal exam deferred given normal PSA and no LUTS. He has some dysplastic nevi and seborrheic keratoses. He has no lymphadenopathy or edema. Neurologic and musculoskeletal exams are normal. Pedal pulses 2+.     Impression-  Hyperlipidemia, with upward trend   Elevated blood pressure, mild   He will have a carotid duplex to inform a decision about medication for blood pressure and lipids  Health maintenance.   Immunizations updated.  HbA1c 6.1%, discussed  Gout, treats as needed  Nephrolithiasis/renal cyst mural nodule, repeat ultrasound next year.  Reports avoiding soft drinks    recheck 1 year    Current Outpatient Medications   Medication Sig    predniSONE (DELTASONE) 20 MG tablet Take 1 tablet (20 mg total) by mouth daily    colchicine-probenecid (COLBENEMID) 0.5-500 MG per tablet Take two tablets then one two hours later    COVID-19 mRNA vaccine, Moderna, (MODERNA  COVID-19 VACCINE) 100 MCG/0.5ML Inject into the muscle    tamsulosin (FLOMAX) 0.4 MG capsule Take 1 capsule (0.4 mg total) by mouth daily

## 2020-01-24 ENCOUNTER — Ambulatory Visit
Admission: RE | Admit: 2020-01-24 | Discharge: 2020-01-24 | Disposition: A | Discharge status: Home / Self Care | Payer: BLUE CROSS/BLUE SHIELD | Source: Ambulatory Visit | Attending: Vascular Surgery | Admitting: Vascular Surgery

## 2020-01-24 DIAGNOSIS — H538 Other visual disturbances: Secondary | ICD-10-CM

## 2020-01-24 DIAGNOSIS — E785 Hyperlipidemia, unspecified: Secondary | ICD-10-CM | POA: Insufficient documentation

## 2020-01-24 LAB — CV US CAROTID BILATERAL
Left Carotid Bulb EDV: 14.96 cm/s
Left Carotid Bulb PSV: 65.56 cm/s
Left Common Carotid Artery EDV Dist: 20.36 cm/s
Left Common Carotid Artery EDV Prox: 25.2 cm/s
Left Common Carotid Artery PSV Dist: 101.13 cm/s
Left Common Carotid Artery PSV Prox: 135.06 cm/s
Left External Carotid Artery EDV: 15.94 cm/s
Left External Carotid Artery PSV: 127.41 cm/s
Left ICA/CCA Ratio: 0.79
Left Internal Carotid Artery EDV Dist: 29.26 cm/s
Left Internal Carotid Artery EDV Mid: 30.36 cm/s
Left Internal Carotid Artery EDV Prox: 17.16 cm/s
Left Internal Carotid Artery PSV Dist: 85.35 cm/s
Left Internal Carotid Artery PSV Mid: 84.25 cm/s
Left Internal Carotid Artery PSV Prox: 79.86 cm/s
Left Subclavian Artery EDV: 0 cm/s
Left Subclavian Artery PSV: 135.45 cm/s
Left Vertebral Artery EDV: 8.98 cm/s
Left Vertebral Artery PSV: 36.77 cm/s
Right Carotid Bulb EDV: 14.94 cm/s
Right Carotid Bulb PSV: 73.17 cm/s
Right Common Carotid Artery EDV Dist: 20.11 cm/s
Right Common Carotid Artery EDV Prox: 15.72 cm/s
Right Common Carotid Artery PSV Dist: 92.62 cm/s
Right Common Carotid Artery PSV Prox: 102.53 cm/s
Right External Carotid Artery EDV: 13.73 cm/s
Right External Carotid Artery PSV: 108.25 cm/s
Right ICA/CCA Ratio: 0.82
Right Internal Carotid Artery EDV Dist: 21.56 cm/s
Right Internal Carotid Artery EDV Mid: 22.66 cm/s
Right Internal Carotid Artery EDV Prox: 10.89 cm/s
Right Internal Carotid Artery PSV Dist: 80.96 cm/s
Right Internal Carotid Artery PSV Mid: 87.55 cm/s
Right Internal Carotid Artery PSV Prox: 75.79 cm/s
Right Subclavian Artery EDV: 0 cm/s
Right Subclavian Artery PSV: 161.3 cm/s
Right Vertebral Artery EDV: 17.97 cm/s
Right Vertebral Artery PSV: 60.73 cm/s

## 2020-01-25 ENCOUNTER — Encounter: Payer: Self-pay | Admitting: Primary Care

## 2020-05-08 ENCOUNTER — Other Ambulatory Visit: Payer: Self-pay | Admitting: Primary Care

## 2020-05-08 MED ORDER — PREDNISONE 20 MG PO TABS *I*
20.0000 mg | ORAL_TABLET | Freq: Every day | ORAL | 0 refills | Status: DC
Start: 2020-05-08 — End: 2021-01-10

## 2020-05-08 MED ORDER — COLCHICINE-PROBENECID 0.5-500 MG PO TABS *A*
ORAL_TABLET | ORAL | 0 refills | Status: DC
Start: 2020-05-08 — End: 2021-01-10

## 2020-05-08 NOTE — Telephone Encounter (Signed)
Last OV 12/22/19  No pendingOV

## 2020-06-03 IMAGING — MR MRI LSPINE WO CONTRAST
4 of 6 series · 17 of 48 positions shown · non-contrast
Comparison: Lumbar spine radiographs 10/17/2017

HISTORY: Radiculopathy lumbar region
TECHNIQUE: Routine multiplanar MRI of the lumbar spine was performed without IV contrast.

[Series 19: t2_axial · axial · 4.0mm · 0.62mm/px · z∈[-470,-288]mm · 6 of 39 slices shown]
[im 1/39]
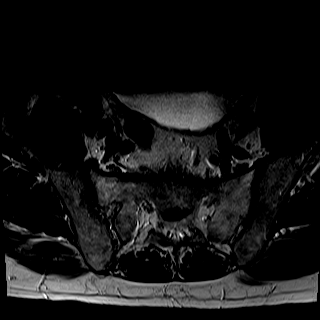
[im 8/39]
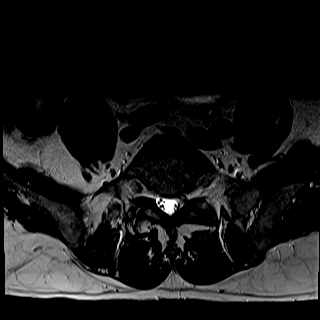
[im 16/39]
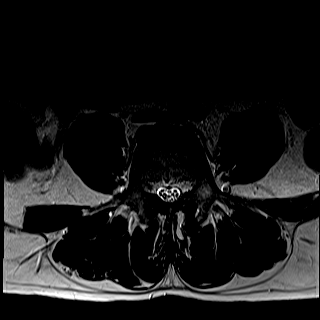
[im 23/39]
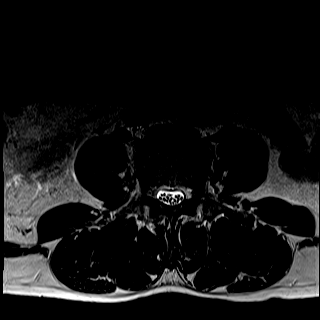
[im 31/39]
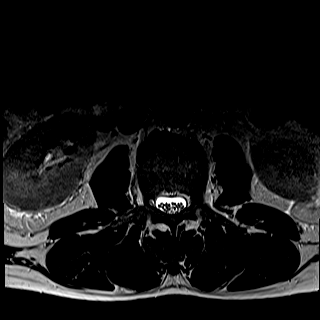
[im 39/39]
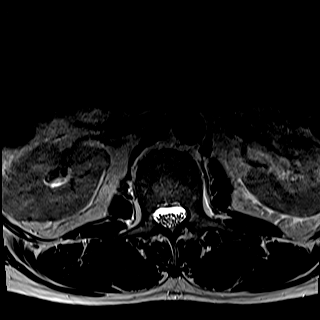

[Series 20: t1_axial_obli · axial · 3.0mm · 0.43mm/px · z∈[-500,-284]mm · 5 of 26 slices shown]
[im 1/26]
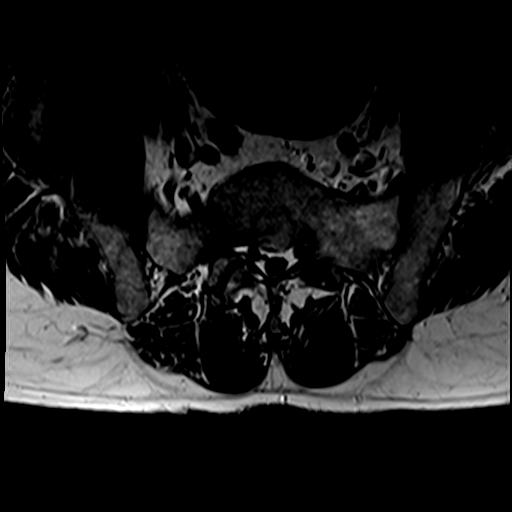
[im 7/26]
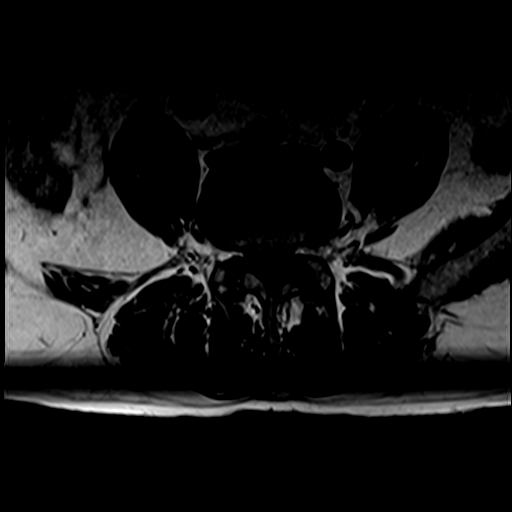
[im 13/26]
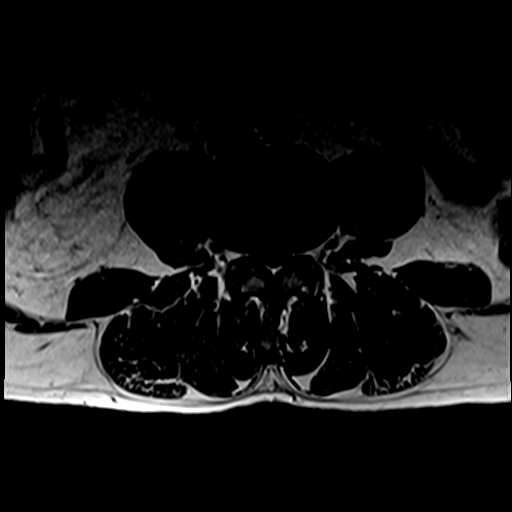
[im 19/26]
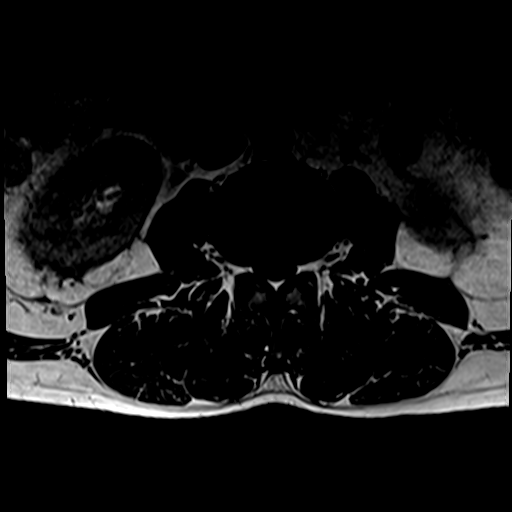
[im 26/26]
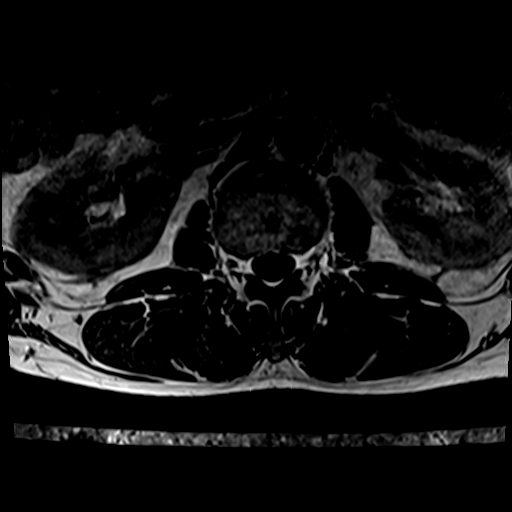

[Series 5020: t2_sag · sagittal · 4.0mm · 0.31mm/px · 3 of 15 slices shown]
[im 1/15]
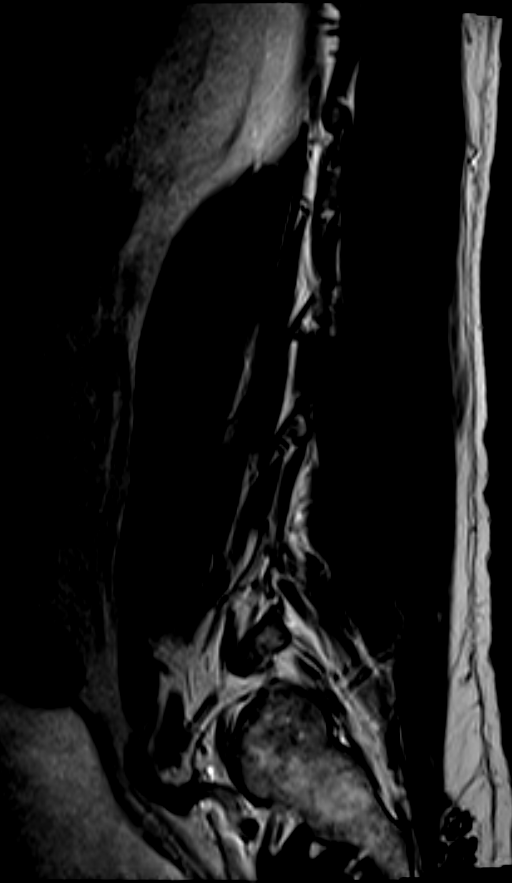
[im 8/15]
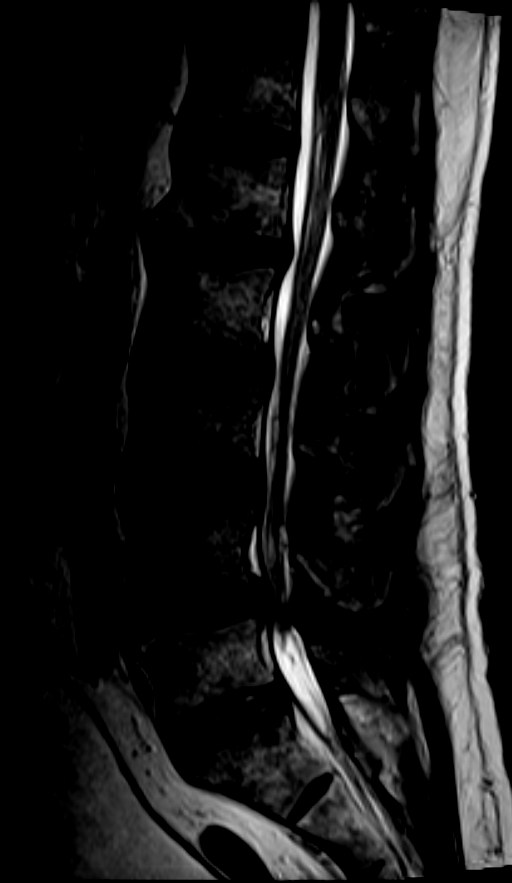
[im 15/15]
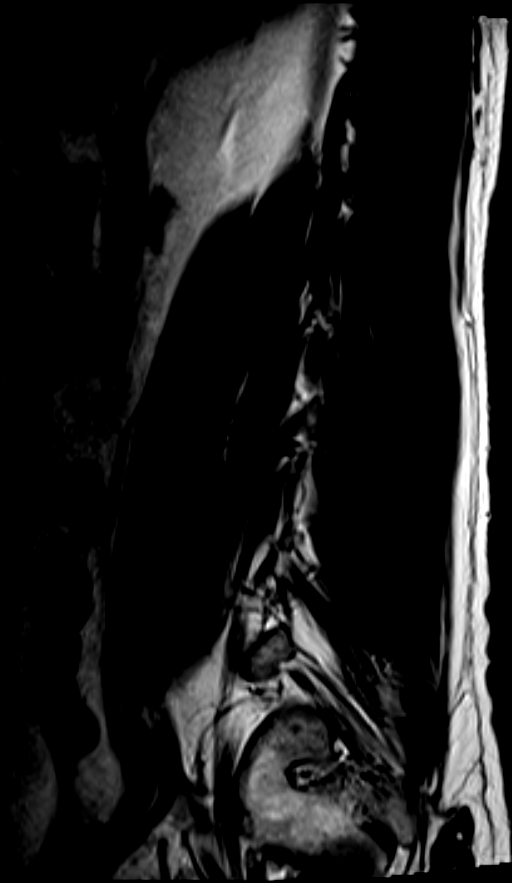

[Series 5027: t1_sag · sagittal · 4.0mm · 0.31mm/px · 3 of 15 slices shown]
[im 1/15]
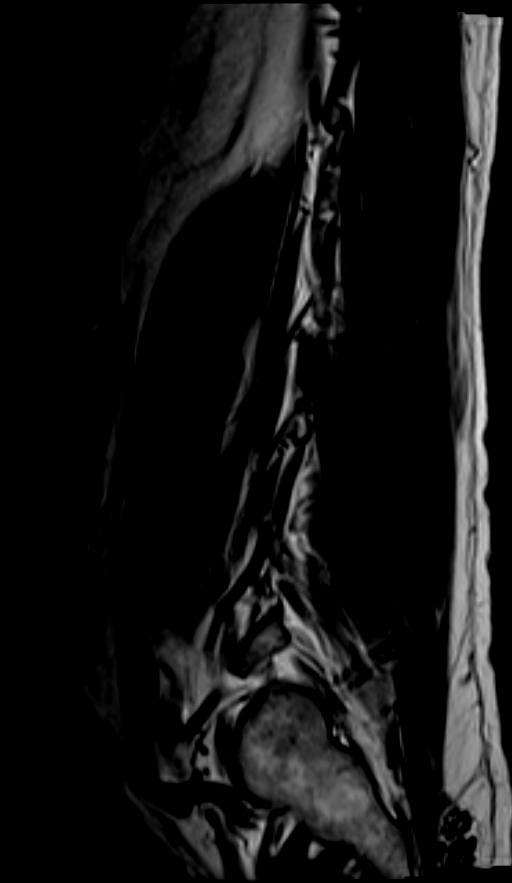
[im 8/15]
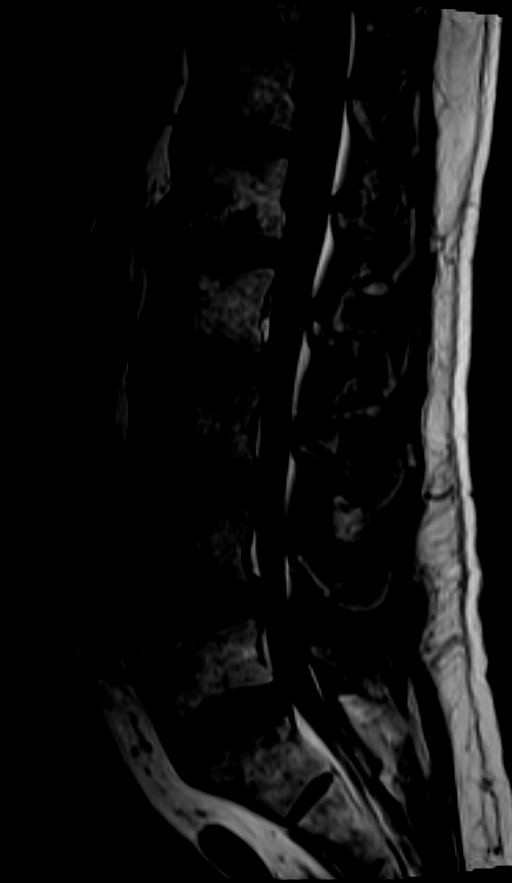
[im 15/15]
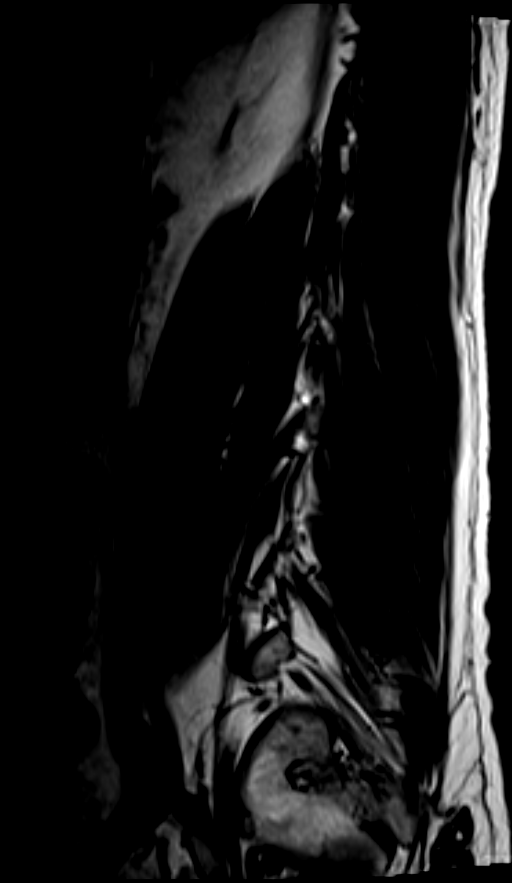

[17 of 48 positions shown; findings below may reference images not displayed]

FINDINGS: Lumbar segments demonstrate mild grade 1 anterolisthesis at L4-5. Remaining segments demonstrate appropriate alignment. No fractures. No destructive lesion. There is mild multilevel degenerative disc desiccation and diffuse disc bulging. No abnormal signal in an intradural or paraspinous position. Canal small on a congenital basis measuring only 9 mm in AP dimension as measured posterior to the L4 vertebral body. Conus medullaris terminates normally at L1 and maintains normal signal. Cauda equina is unremarkable.

L1-2: Mild diffuse disc bulge. Mild facet arthropathy. No canal stenosis. No significant degree of foraminal stenosis.

L2-3: Moderate diffuse disc bulge. Bilateral facet arthropathy. Mild canal stenosis. No significant degree of foraminal stenosis.

L3-4: Moderate diffuse disc bulge. Moderately advanced bilateral facet arthropathy. Moderate canal stenosis. No significant degree of foraminal stenosis.

L4-5: Moderate diffuse disc bulge. Superimposed left paracentral/left lateral canal 2 mm broad-based protrusion. Moderate to severe canal stenosis. Bilateral facet arthropathy. Mild foraminal stenosis.

L5-S1: Diffuse disc bulge. Bilateral facet arthropathy. No canal stenosis. No significant degree of foraminal stenosis.

Prominent urinary bladder. Correlate for outflow obstruction.
IMPRESSION: Multilevel degenerative disc disease and facet arthropathy producing multilevel canal and foraminal stenoses as detailed above. Most notably, there is a moderate to severe degree of canal stenosis at L4-5. Mild grade 1 anterolisthesis L4-5. Shallow left-sided protrusion L4-5 No fractures. No destructive lesions. Prominent urinary bladder. Correlate for outflow obstruction.

## 2021-01-10 ENCOUNTER — Other Ambulatory Visit: Payer: Self-pay | Admitting: Primary Care

## 2021-01-10 MED ORDER — PREDNISONE 20 MG PO TABS *I*
20.0000 mg | ORAL_TABLET | Freq: Every day | ORAL | 0 refills | Status: DC
Start: 2021-01-10 — End: 2021-04-10

## 2021-01-10 MED ORDER — COLCHICINE-PROBENECID 0.5-500 MG PO TABS *A*
ORAL_TABLET | ORAL | 0 refills | Status: DC
Start: 2021-01-10 — End: 2021-04-10

## 2021-01-10 NOTE — Telephone Encounter (Signed)
Last visit:12/22/19  Next visit: 01/21/21

## 2021-01-18 ENCOUNTER — Other Ambulatory Visit: Payer: Self-pay

## 2021-01-18 ENCOUNTER — Telehealth: Payer: Self-pay | Admitting: Primary Care

## 2021-01-18 ENCOUNTER — Ambulatory Visit: Payer: BLUE CROSS/BLUE SHIELD | Attending: Family Medicine | Admitting: Family Medicine

## 2021-01-18 VITALS — BP 172/82 | HR 91 | Temp 98.8°F | Resp 16

## 2021-01-18 DIAGNOSIS — Z20822 Contact with and (suspected) exposure to covid-19: Secondary | ICD-10-CM

## 2021-01-18 DIAGNOSIS — J069 Acute upper respiratory infection, unspecified: Secondary | ICD-10-CM | POA: Insufficient documentation

## 2021-01-18 DIAGNOSIS — Z20828 Contact with and (suspected) exposure to other viral communicable diseases: Secondary | ICD-10-CM | POA: Insufficient documentation

## 2021-01-18 DIAGNOSIS — J101 Influenza due to other identified influenza virus with other respiratory manifestations: Secondary | ICD-10-CM

## 2021-01-18 NOTE — Telephone Encounter (Signed)
Patient having symptoms described below. Feeling somewhat better since the phone call. Has a productive cough with yellow-red tinged sputum, chest tightness and fever. Has some lightheadedness. Using Advil.     Recommended patient have an evaluation Urgent Care or schedule a telemedicine visit, we do not have any openings today. He has an appointment on Monday for a physical. Advised he can continuing using Advil, hydration, cough syrup with dextromethorphan and ambulation/rest as tolerated. [facility] message sent to patient on what he can take and how to schedule a telemedicine visit if he decides to do so.

## 2021-01-18 NOTE — Patient Instructions (Signed)
If you were tested for COVID, you should isolate until you get your COVID   test results. If your test is positive, you will need to continue   isolation until cleared by the Department of Health. For more details   about this, please see the attached documents.     We will no longer contact patients whose COVID test is positive and have a   [facility] MyChart account. Your result will be available to you at the   same time it is to us.    We will not contact any patients whose COVID test is negative. We suggest   you sign up for MyChart to see your result. If you do not have a MyChart   account, there will be instructions attached.     Your symptoms are most consistent with a viral upper respiratory illness.  These usually last 7-10 days.  The cough may take several weeks to completely resolve.  Antibiotics do not work on viruses.  When antibiotics aren't needed, they won't help make you better faster, and their side effects can make things worse.  The treatments below can make you feel better while your body fights off the virus.    Rest and plenty of fluids.    Pain or fever:    - Tylenol 1000mg every 8 hours as needed and/or ibuprofen 400mg every 4-6 hours as needed, take ibuprofen with food.     Congestion:  -  Nasal saline rinse to help flush out sinus passages.  Use distilled water or water that has been boiled and cooled.  Try to do it twice a day, morning and night.  -  Fluticasone (Flonase) nasal spray once a day, use 15 minutes after nasal saline rinse.  -  Can use Afrin nasal spray for 3 days only.  -  A humidifier can help as well.    Sore Throat:  -  Gargle with glass of warm water with one teaspoon of salt stirred in 2-3 times a day.  -  Chloraseptic throat spray or Cepacol lozenges as needed.    Cough:  -  Mucinex DM or Delsym for cough suppression.    -  One teaspoon of honey (not in tea or water) every 4-6 hours for cough suppression.  -  Mucinex/guaifenesin to thin out mucus.  -  Humidified air  and hot steamy showers are helpful.  -  Cover your cough and wash hands often.    Reasons to come back to us or go to regular doctor:  - Fever (100.5F or higher) for more than 2-3 days or that do not improve with Tylenol and ibuprofen.  -  Shortness of breath, wheezing, or chest pain.  -  Worsening ear pain or sinus pain for more than 10 days.  -  Not being able to eat or drink fluids.    You are considered contagious throughout the course of your virus.  You are most contagious at the beginning of your symptoms.  Frequent handwashing and covering your mouth when you cough or sneeze can help lower the risk of infecting other people.

## 2021-01-18 NOTE — UC Provider Note (Signed)
Chief Complaint:   Chief Complaint   Patient presents with    URI     Pt reports since Wednesday morning: scratchy throat, deep productive cough, dizziness, fatigue, fever (101.8 this morning. Pt took advil at 8am.        HPI:  60 y.o. male w hx as noted in chart presents to Urgent Care with symptoms concerning for COVID 19. Patient complains of 2 day(s) of the following symptoms: Fever, body aches, fatigue, nasal congestion, sore throat, and productive cough.  He reports intermittent shortness of breath.    Denies: Chest tightness, nausea, vomiting, or diarrhea.    Symptoms started suddenly and have waxed and waned.     COVID exposure / other sick contacts: no  COVID 19 positive in the last 3 months: no  Fully vaccinated for COVID 19: yes      VITALS:  BP 172/82    Pulse 91    Temp 37.1 C (98.8 F) (Temporal)    Resp 16    SpO2 97%      ROS: See HPI above     PHYSICAL EXAM:  Appearance: Well appearing, no acute distress   Eyes: No conjunctival injection or drainage  Ears: Normal TMs and canals bilaterally  Nose: normal   Throat: Mild posterior oropharynx erythema, uvula midline, tonsils 1+ without exudate.  Neck: no cervical LAD or tenderness, soft  CV: RRR, no murmurs   Pulm: Lungs Clear to ausculation bilaterally, no acute respiratory distress     UC LABS:    Orders Placed This Encounter   Procedures    Influenza A PCR    Influenza B PCR    RSV PCR    COVID-19 PCR        MDM:  60 y.o. male w hx as noted in chart presents with symptoms concerning for COVID-19 vs other mild viral illness. COVID 19 PCR testing ordered (see above if other labs ordered). The patient was informed the COVID 19 result will be available in MyChart and the need for isolation per CDC guidelines. Recommend supportive care at this time. Advised close follow up w PCP as needed. Appropriate Handouts provided.     Given the patient's reassuring vital signs and overall well appearance, the patient can be managed safely at home. Hydration  advised. Patient advised to go to ED for any worsening or concerning symptoms.

## 2021-01-18 NOTE — Telephone Encounter (Signed)
Patient called states he is having chest congestion coughing up yellow and occasionally some red mucous and running a 101.7 fever. Occasionally Short of Breath and some Chest tightness. Did a home test for COVID tested negative.  Please call [phone].

## 2021-01-19 LAB — INFLUENZA A: Influenza A PCR: POSITIVE — AB

## 2021-01-19 LAB — COVID-19 NAAT (PCR): COVID-19 NAAT (PCR): NEGATIVE

## 2021-01-19 LAB — INFLUENZA B PCR: Influenza B PCR: 0

## 2021-01-19 LAB — RSV PCR: RSV PCR: 0

## 2021-01-19 LAB — COVID-19 PCR

## 2021-01-19 NOTE — Result Encounter Note (Signed)
Influenza PCR result is positive and communicated to patient/guardian via MyChart message with additional recommendations for supportive care, treatment, monitoring and follow up precautions.

## 2021-01-21 ENCOUNTER — Encounter: Payer: Self-pay | Admitting: Primary Care

## 2021-01-21 ENCOUNTER — Other Ambulatory Visit
Admission: RE | Admit: 2021-01-21 | Discharge: 2021-01-21 | Disposition: A | Discharge status: Home / Self Care | Payer: BLUE CROSS/BLUE SHIELD | Source: Ambulatory Visit | Attending: Primary Care | Admitting: Primary Care

## 2021-01-21 ENCOUNTER — Ambulatory Visit: Payer: BLUE CROSS/BLUE SHIELD | Admitting: Primary Care

## 2021-01-21 ENCOUNTER — Other Ambulatory Visit: Payer: Self-pay

## 2021-01-21 VITALS — BP 138/76 | HR 76 | Ht 68.0 in | Wt 190.6 lb

## 2021-01-21 DIAGNOSIS — Z0000 Encounter for general adult medical examination without abnormal findings: Secondary | ICD-10-CM | POA: Insufficient documentation

## 2021-01-21 DIAGNOSIS — Z23 Encounter for immunization: Secondary | ICD-10-CM

## 2021-01-21 LAB — URINALYSIS WITH REFLEX TO MICROSCOPIC
Blood,UA: NEGATIVE
Glucose,UA: NEGATIVE
Leuk Esterase,UA: NEGATIVE
Nitrite,UA: NEGATIVE
Specific Gravity,UA: 1.032 — ABNORMAL HIGH (ref 1.002–1.030)
pH,UA: 5.5 (ref 5.0–8.0)

## 2021-01-21 LAB — URINE MICROSCOPIC (IQ200): Bacteria,UA: NONE SEEN

## 2021-01-21 LAB — LIPID PANEL
Chol/HDL Ratio: 4.5
Cholesterol: 206 mg/dL — AB
HDL: 46 mg/dL (ref 40–60)
LDL Calculated: 129 mg/dL
Non HDL Cholesterol: 160 mg/dL
Triglycerides: 156 mg/dL — AB

## 2021-01-21 LAB — COMPREHENSIVE METABOLIC PANEL
ALT: 38 U/L (ref 0–50)
AST: 35 U/L (ref 0–50)
Albumin: 4.3 g/dL (ref 3.5–5.2)
Alk Phos: 87 U/L (ref 40–130)
Anion Gap: 13 (ref 7–16)
Bilirubin,Total: 0.3 mg/dL (ref 0.0–1.2)
CO2: 26 mmol/L (ref 20–28)
Calcium: 9.2 mg/dL (ref 8.6–10.2)
Chloride: 102 mmol/L (ref 96–108)
Creatinine: 1.09 mg/dL (ref 0.67–1.17)
Glucose: 101 mg/dL — ABNORMAL HIGH (ref 60–99)
Lab: 19 mg/dL (ref 6–20)
Potassium: 4.3 mmol/L (ref 3.3–5.1)
Sodium: 141 mmol/L (ref 133–145)
Total Protein: 6.9 g/dL (ref 6.3–7.7)
eGFR BY CREAT: 77 *

## 2021-01-21 LAB — CBC
Hematocrit: 45 % (ref 40–51)
Hemoglobin: 14.5 g/dL (ref 13.7–17.5)
MCH: 27 pg (ref 26–32)
MCHC: 32 g/dL (ref 32–37)
MCV: 85 fL (ref 79–92)
Platelets: 237 10*3/uL (ref 150–330)
RBC: 5.3 MIL/uL (ref 4.6–6.1)
RDW: 13.3 % (ref 11.6–14.4)
WBC: 6.5 10*3/uL (ref 4.2–9.1)

## 2021-01-21 LAB — PCMH DEPRESSION ASSESSMENT

## 2021-01-21 LAB — PSA (EFF.4-2010): PSA (eff. 4-2010): 2.87 ng/mL (ref 0.00–4.00)

## 2021-01-21 NOTE — Addendum Note (Signed)
Addended by: Patsey Berthold on: 01/21/2021 05:22 PM     Modules accepted: Orders

## 2021-01-21 NOTE — Progress Notes (Signed)
He has no complaints      Medical history remarkable for tonsillectomy vasectomy dysplastic nevi kidney stone gout    Family history reviewed negative for colon and prostate cancer. One grandparent had diabetes. Father died of suicide at age 62. Brother has cerebral palsy and hypertension. No family history of colon polyps or aneurysm. Mother's sister had brain cancer. Some skin cancers in a [place] relative. 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 Married, 2 children grown, never smoked,no more than 4alcoholic beverages per week. Walking 45 minutes 3 times per week    Medications none regular    Review of systems denies nocturia     BP 138/76    Pulse 76    Ht 1.727 m (5\' 8" )    Wt 86.5 kg (190 lb 9.6 oz)    SpO2 97%    BMI 28.98 kg/m       HEENT exam is normal. Neck and thyroid normal. Chest clear to auscultation  Heart regular without murmur. Abdomen without tenderness mass organomegaly. Genital exam is normal. Circumcised. He has some dysplastic neviandseborrheic keratoses. He has no lymphadenopathy or edema. Neurologicandmusculoskeletal exams are normal. Pedal pulses 2+.     Impression-  Hyperlipidemia,  normal carotid duplex 2021, repeat lipids  Health maintenance.  Immunizations updated.  check PSA uric acid HbA1c  Gout, treats as needed  Nephrolithiasis/renal cyst mural nodule, repeat ultrasound.  If mural nodule again is unchanged first noticed November 2020 discontinue surveillance    Current Outpatient Medications   Medication Sig    colchicine-probenecid (COLBENEMID) 0.5-500 MG per tablet Take two tablets then one two hours later    predniSONE (DELTASONE) 20 mg tablet Take 1 tablet (20 mg total) by mouth daily

## 2021-01-22 LAB — HEMOGLOBIN A1C: Hemoglobin A1C: 6.4 % — ABNORMAL HIGH

## 2021-01-26 ENCOUNTER — Encounter: Payer: Self-pay | Admitting: Primary Care

## 2021-02-28 ENCOUNTER — Ambulatory Visit
Admission: RE | Admit: 2021-02-28 | Discharge: 2021-02-28 | Disposition: A | Discharge status: Home / Self Care | Payer: BLUE CROSS/BLUE SHIELD | Source: Ambulatory Visit | Attending: Primary Care | Admitting: Primary Care

## 2021-02-28 ENCOUNTER — Other Ambulatory Visit: Payer: Self-pay

## 2021-02-28 DIAGNOSIS — N281 Cyst of kidney, acquired: Secondary | ICD-10-CM

## 2021-02-28 DIAGNOSIS — Z0000 Encounter for general adult medical examination without abnormal findings: Secondary | ICD-10-CM | POA: Insufficient documentation

## 2021-03-01 ENCOUNTER — Encounter: Payer: Self-pay | Admitting: Primary Care

## 2021-04-10 ENCOUNTER — Other Ambulatory Visit: Payer: Self-pay | Admitting: Primary Care

## 2021-04-10 NOTE — Telephone Encounter (Signed)
Last seen 01/21/22  Upcoming none

## 2021-04-11 MED ORDER — PREDNISONE 20 MG PO TABS *I*
20.0000 mg | ORAL_TABLET | Freq: Every day | ORAL | 0 refills | Status: DC
Start: 2021-04-11 — End: 2021-08-15

## 2021-04-11 MED ORDER — COLCHICINE-PROBENECID 0.5-500 MG PO TABS *A*
ORAL_TABLET | ORAL | 0 refills | Status: DC
Start: 2021-04-11 — End: 2021-08-15

## 2021-05-31 ENCOUNTER — Encounter: Payer: Self-pay | Admitting: Primary Care

## 2021-05-31 ENCOUNTER — Ambulatory Visit: Payer: BLUE CROSS/BLUE SHIELD | Admitting: Primary Care

## 2021-05-31 ENCOUNTER — Other Ambulatory Visit: Payer: Self-pay

## 2021-05-31 VITALS — BP 152/82 | HR 91 | Ht 68.0 in | Wt 179.7 lb

## 2021-05-31 DIAGNOSIS — M79673 Pain in unspecified foot: Secondary | ICD-10-CM

## 2021-06-01 ENCOUNTER — Encounter: Payer: Self-pay | Admitting: Primary Care

## 2021-06-01 NOTE — Progress Notes (Signed)
Beginning in late February while using treadmill for exercise he developed pain in the right first MTP joint.  Pain varies.  Weightbearing aggravating.  Weight decreased 11 pounds this year, encouraged to lose weight because of possibility of diabetes  BP 152/82   Pulse 91   Ht 1.727 m (5\' 8" )   Wt 81.5 kg (179 lb 11.2 oz)   BMI 27.32 kg/m   Exam he has some focal tenderness of the right first MTP joint but no acute inflammatory signs.  Passive ranging of the joint is not provocative.    Impression  Right foot pain, referred to orthopedics.  It is important for him to be able to continue exercise.    Follow-up physical approximately December including HbA1c and renal ultrasound (in January I asked radiology by email whether there was some test to have closure concerning his renal cyst with no response obtained to date)

## 2021-06-13 ENCOUNTER — Other Ambulatory Visit: Payer: Self-pay

## 2021-06-13 ENCOUNTER — Ambulatory Visit: Payer: BLUE CROSS/BLUE SHIELD

## 2021-06-13 ENCOUNTER — Ambulatory Visit
Admission: RE | Admit: 2021-06-13 | Discharge: 2021-06-13 | Disposition: A | Discharge status: Home / Self Care | Payer: BLUE CROSS/BLUE SHIELD | Source: Ambulatory Visit

## 2021-06-13 VITALS — HR 78 | Ht 68.0 in | Wt 179.0 lb

## 2021-06-13 DIAGNOSIS — M79671 Pain in right foot: Secondary | ICD-10-CM

## 2021-06-13 DIAGNOSIS — M85871 Other specified disorders of bone density and structure, right ankle and foot: Secondary | ICD-10-CM | POA: Insufficient documentation

## 2021-06-13 DIAGNOSIS — M19071 Primary osteoarthritis, right ankle and foot: Secondary | ICD-10-CM | POA: Insufficient documentation

## 2021-06-13 DIAGNOSIS — M7731 Calcaneal spur, right foot: Secondary | ICD-10-CM | POA: Insufficient documentation

## 2021-06-16 NOTE — Progress Notes (Signed)
DIAGNOSES: Right posterior tibial tendinitis    FOLLOW UP VISIT: As needed    XRAYS ON RETURN: None    HISTORY OF PRESENT ILLNESS: Patient is a healthy 60 year old male who presents today with complaints of right medial foot pain.  He states he had a similar issue about 1 year ago but it resolved on its own.  He states that in January he started working out 3 times a week on his treadmill and in February tried switching the treadmill.  Not long after this he experienced pain in the foot preventing him from being able to use the treadmill.  He states the pain flares up with walking and is worse by the end of the day.  He does note occasional swelling.  He denies any injury or trauma to the foot.    Answers for HPI/ROS submitted by the patient on 06/13/2021  What is your goal for today's visit?: To identify the cause of my chronic foot pain and find a way to eliminate it, if possible.  Date of onset: : 03/20/2021  Was this the result of an injury?: No  What is your pain level?: 5/10  Please describe the quality of your pain: : aching, discomfort, sharp  What diagnostic workup have you had for this condition?: no prior workup  What treatments have you tried for this condition?: other  Progression since onset: : waxing and waning  Is this a work related condition? : No  Current work status: : usual activities  Fever: No  Chills: No  Weight loss: No  Malaise/fatigue: No  Rash: No  Dry skin: No  Itching: No  Wound : No  Headache: No  Hearing loss: No  Sore throat: No  Dental problem: No  Blindness: No  Visual disturbance: No  Chest pain: No  Palpitations: No  Leg cramps with exercise: No  Leg swelling: No  Fainting: No  Heartburn: No  Nausea: No  Vomiting: No  Diarrhea: No  Constipation: No  Blood in stool: No  Cough : No  Apnea: No  Shortness of breath: No  Frequency: No  Urgency: No  Blood in urine: No  Incomplete emptying: No  Falls: No  Neck pain: No  Back pain: No  Joint swelling: No  Muscle spasms: No  Muscle cramps:  No  Easy to bruise/bleed: No  Frequent infections: No  Numbness: No  Tingling: No  Seizures: No  Loss of consciousness: No  Weakness: No  Depression: No  Nervous/anxious: No  Memory loss: No    PHYSICAL EXAMINATION: Patient appears well no acute distress.  Right lower extremity is neurovascularly intact with mild swelling along the medial aspect of the foot and ankle.  Skin is intact throughout with no areas of erythema or ecchymosis.  Cavus standing alignment, no gross deformity.  Patient is tender to palpation along the posterior tibial tendon.  No tenderness along the peroneals or to the medial or lateral malleolus.  No tenderness to deep palpation at the anterior ankle.  No tenderness across the midfoot or metatarsals.  Negative Calc squeeze test.  Achilles is intact, palpable with no noted defects.  He has good range of motion with plantar and dorsiflexion as well as inversion and eversion.  Endorses medial ankle pain with resisted inversion and dorsiflexion.  Palpable pedal pulse.  Calf is soft and nontender.    DIAGNOSTIC IMAGING: Weightbearing right foot films reviewed in room with patient.  No evidence of acute fracture or dislocation.  There  are no avulsions or evidence of stress fracture.  Cavus alignment.    ASSESSMENT/PLAN: Discussed with patient he seems to be experiencing a flareup of posterior tibial tendinitis which may have been brought on by the treadmill workouts he was doing in January into February.  Patient was provided with a lace up ASO ankle brace today to wear and try and help rest the posterior tibial tendon.  Encouraged him to use this at all times when he is up and active.  Also recommend ice rather than heat, oral and topical anti-inflammatory medications, and very gradual reintroduction of exercise.  He will follow-up in 6 weeks as needed if his symptoms fail to improve or worsen.    Jackquline Denmark, NP

## 2021-07-12 ENCOUNTER — Other Ambulatory Visit: Payer: Self-pay

## 2021-07-12 ENCOUNTER — Ambulatory Visit: Payer: BLUE CROSS/BLUE SHIELD

## 2021-07-12 ENCOUNTER — Ambulatory Visit
Admission: RE | Admit: 2021-07-12 | Discharge: 2021-07-12 | Disposition: A | Discharge status: Home / Self Care | Payer: BLUE CROSS/BLUE SHIELD | Source: Ambulatory Visit

## 2021-07-12 VITALS — BP 149/86 | HR 70 | Ht 68.0 in | Wt 179.0 lb

## 2021-07-12 DIAGNOSIS — M79671 Pain in right foot: Secondary | ICD-10-CM | POA: Insufficient documentation

## 2021-07-12 DIAGNOSIS — M7731 Calcaneal spur, right foot: Secondary | ICD-10-CM

## 2021-07-12 DIAGNOSIS — M19071 Primary osteoarthritis, right ankle and foot: Secondary | ICD-10-CM

## 2021-07-12 DIAGNOSIS — M2011 Hallux valgus (acquired), right foot: Secondary | ICD-10-CM

## 2021-07-12 DIAGNOSIS — L600 Ingrowing nail: Secondary | ICD-10-CM | POA: Insufficient documentation

## 2021-07-14 NOTE — Progress Notes (Signed)
DIAGNOSES: Right great toe pain    FOLLOW UP VISIT: As needed    XRAYS ON RETURN: None    HISTORY OF PRESENT ILLNESS: Patient presents today for evaluation of new problem.  He states that his posterior tibial tendinitis has mostly resolved with use of his ankle brace.  He noticed recently he began to develop pain and swelling over the medial aspect of the right great toe.  He denies any injury or trauma to the toe.  He does note that he developed an ingrown toenail recently which she has had several in the past and manages on his own.  He states that the pain is not right where the ingrown is but more along the inner edge of his toe.  He states it feels swollen and there is some erythema by the end of the day.  It is mostly irritated by rubbing in his shoes.  He normally wears a stiffer dress shoe but has recently changed his sneakers which helps some.  He also has a history of gout and tried taking his gout medication for this problem to see if it would help but did not give much relief.  He states the pain is present when he is weightbearing but when he is at rest it is improved.    Answers for HPI/ROS submitted by the patient on 07/12/2021  What is your goal for today's visit?: to determine the cause of the new pain in my right toe  Date of onset: : 06/28/2021  Was this the result of an injury?: No  What is your pain level?: 7/10  Please describe the quality of your pain: : aching, burning, discomfort, dull ache, sharp, stabbing, tenderness  What diagnostic workup have you had for this condition?: no prior workup  What treatments have you tried for this condition?: activity modification, bracing, ice, other  Progression since onset: : gradually worsening  Is this a work related condition? : No  Current work status: : usual activities  Fever: No  Chills: No  Numbness: No  Tingling: No    PHYSICAL EXAMINATION: Patient appears well no acute distress.  Right lower extremity is neurovascularly intact with mild swelling  of the great toe more distally and surrounding the medial nail bed.  There is slight erythema present along the medial border of the IP joint.  He is tender to palpation at the medial IP joint of the great toe.  No tenderness throughout the first MTP joint, negative grind test.  He is not significantly tender to the medial nailbed.  Area of ingrown toenail of great toe medially, skin is intact with no areas of erythema or ecchymosis.  No evidence of active infection.  Cavus standing alignment, acceptable toe alignment. He has good range of motion with plantar and dorsiflexion as well as inversion and eversion.  He is able to wiggle the toes. Palpable pedal pulse.  Calf is soft and nontender.    DIAGNOSTIC IMAGING: Weightbearing right foot films reviewed in room with patient.  No evidence of acute fracture or dislocation.  There are no avulsions or evidence of stress fracture.  Great toe IP joint with no significant degenerative changes.    ASSESSMENT/PLAN: Discussed with patient that it does not seem he is experiencing a flare of gout at this time.  X-ray from today did not show any acute bony abnormalities at the IP joint.  I do think that the increased swelling in the great toe from his ingrown toenail may be  causing prominence of the IP joint medially which is rubbing against his shoes.  He was provided with a silicone toe sleeve for the great toe to provide some protection from rubbing in his shoes as well as give some compression to help with the swelling.  I would recommend that he try a more accommodative wider toe box shoe and work on soaking the toe to allow the ingrown nail to protrude.  He may follow-up as needed for symptoms worsen or if any new issues.    Jackquline Denmark, NP

## 2021-08-14 ENCOUNTER — Other Ambulatory Visit: Payer: Self-pay | Admitting: Primary Care

## 2021-08-14 NOTE — Telephone Encounter (Signed)
General foot pain    Symptoms:gut pain rt foot      When did symptoms start? 3 weeks ago no problems until one last nightDid you try anything to lessen your symptoms? Yes pt use the colchicine would like a call back.

## 2021-08-15 ENCOUNTER — Other Ambulatory Visit: Payer: Self-pay | Admitting: Primary Care

## 2021-08-15 MED ORDER — PREDNISONE 20 MG PO TABS *I*
20.0000 mg | ORAL_TABLET | Freq: Every day | ORAL | 0 refills | Status: DC
Start: 2021-08-15 — End: 2021-08-22

## 2021-08-15 MED ORDER — COLCHICINE-PROBENECID 0.5-500 MG PO TABS *A*
ORAL_TABLET | ORAL | 0 refills | Status: DC
Start: 2021-08-15 — End: 2022-03-07

## 2021-08-15 NOTE — Telephone Encounter (Signed)
Pt called this am, is having a gout attack rt foot, big toe.  He usually takes 3 Colchicine tabs within 2 hrs, and then 1 Prednisone every day x 5 days.  He has started this, but it is not responding.  He is out of his meds now.    Also, needs refill of these meds.  He is hoping to get this under control.  He is aware Dr. Sharon Mt is out of office.  He saw Ortho recently for unrelated rt foot pain.    He would like to speak with a nurse.  If appointment is needed, please let pt know at # above.      Last ov 05/31/21, no future ov.

## 2021-08-22 ENCOUNTER — Other Ambulatory Visit
Admission: RE | Admit: 2021-08-22 | Discharge: 2021-08-22 | Disposition: A | Discharge status: Home / Self Care | Payer: BLUE CROSS/BLUE SHIELD | Source: Ambulatory Visit | Attending: Primary Care | Admitting: Primary Care

## 2021-08-22 ENCOUNTER — Other Ambulatory Visit: Payer: Self-pay

## 2021-08-22 ENCOUNTER — Encounter: Payer: Self-pay | Admitting: Primary Care

## 2021-08-22 ENCOUNTER — Ambulatory Visit: Payer: BLUE CROSS/BLUE SHIELD | Admitting: Primary Care

## 2021-08-22 VITALS — BP 140/88 | HR 75 | Ht 68.0 in | Wt 178.8 lb

## 2021-08-22 DIAGNOSIS — Z0000 Encounter for general adult medical examination without abnormal findings: Secondary | ICD-10-CM | POA: Insufficient documentation

## 2021-08-22 DIAGNOSIS — M109 Gout, unspecified: Secondary | ICD-10-CM | POA: Insufficient documentation

## 2021-08-22 DIAGNOSIS — R030 Elevated blood-pressure reading, without diagnosis of hypertension: Secondary | ICD-10-CM

## 2021-08-22 DIAGNOSIS — I872 Venous insufficiency (chronic) (peripheral): Secondary | ICD-10-CM

## 2021-08-22 LAB — URIC ACID: Urate: 6.9 mg/dL (ref 3.9–9.0)

## 2021-08-22 MED ORDER — ALLOPURINOL 300 MG PO TABS *I*
300.0000 mg | ORAL_TABLET | Freq: Every day | ORAL | 3 refills | Status: DC
Start: 2021-08-22 — End: 2022-07-28

## 2021-08-22 MED ORDER — PREDNISONE 20 MG PO TABS *I*
20.0000 mg | ORAL_TABLET | Freq: Every day | ORAL | 0 refills | Status: DC
Start: 2021-08-22 — End: 2022-02-21

## 2021-08-22 NOTE — Progress Notes (Signed)
He had an episode of right podagra again on July 11 following some alcoholic beverages on July 10.  Responds to colchicine and prednisone.  Describes joint as being read swollen tender.  He also has some swelling of his ankles.  BP 140/88 (BP Location: Left arm, Patient Position: Sitting, Cuff Size: adult)   Pulse 75   Ht 1.727 m (5\' 8" )   Wt 81.1 kg (178 lb 12.8 oz)   SpO2 98%   BMI 27.19 kg/m    Blood pressure 146/86  On exam right first MTP joint not acutely inflamed.  He has bilateral edema 1+ of the ankles and distal legs.  Passive ranging of the right ankle and subtalar joint is not provocative.    Impression  Gout, uric acid level and begin allopurinol 300 mg daily.  Discussed alcohol provocation.  He will have repeat blood test after a month's time including uric acid CBC CMP  Venous insufficiency causing swelling.  Elevated blood pressure, submit at least one half dozen 5-minute resting blood pressures to MyChart, recommended Omron cuff

## 2021-11-11 ENCOUNTER — Other Ambulatory Visit: Payer: Self-pay | Admitting: Primary Care

## 2021-11-11 ENCOUNTER — Encounter: Payer: Self-pay | Admitting: Primary Care

## 2021-11-11 DIAGNOSIS — N281 Cyst of kidney, acquired: Secondary | ICD-10-CM

## 2021-11-12 ENCOUNTER — Other Ambulatory Visit
Admission: RE | Admit: 2021-11-12 | Discharge: 2021-11-12 | Disposition: A | Discharge status: Home / Self Care | Payer: BLUE CROSS/BLUE SHIELD | Source: Ambulatory Visit | Attending: Primary Care | Admitting: Primary Care

## 2021-11-12 DIAGNOSIS — M109 Gout, unspecified: Secondary | ICD-10-CM | POA: Insufficient documentation

## 2021-11-12 DIAGNOSIS — N281 Cyst of kidney, acquired: Secondary | ICD-10-CM | POA: Insufficient documentation

## 2021-11-12 LAB — COMPREHENSIVE METABOLIC PANEL
ALT: 28 U/L (ref 0–50)
AST: 33 U/L (ref 0–50)
Albumin: 4.6 g/dL (ref 3.5–5.2)
Alk Phos: 84 U/L (ref 40–130)
Anion Gap: 14 (ref 7–16)
Bilirubin,Total: 0.4 mg/dL (ref 0.0–1.2)
CO2: 27 mmol/L (ref 20–28)
Calcium: 9.4 mg/dL (ref 8.6–10.2)
Chloride: 103 mmol/L (ref 96–108)
Creatinine: 0.99 mg/dL (ref 0.67–1.17)
Glucose: 102 mg/dL — ABNORMAL HIGH (ref 60–99)
Lab: 13 mg/dL (ref 6–20)
Potassium: 4.3 mmol/L (ref 3.3–5.1)
Sodium: 144 mmol/L (ref 133–145)
Total Protein: 7 g/dL (ref 6.3–7.7)
eGFR BY CREAT: 85 *

## 2021-11-12 LAB — CBC
Hematocrit: 45 % (ref 37–52)
Hemoglobin: 14.6 g/dL (ref 12.0–17.0)
MCH: 28 pg (ref 26–32)
MCHC: 33 g/dL (ref 32–37)
MCV: 85 fL (ref 75–100)
Platelets: 228 10*3/uL (ref 150–450)
RBC: 5.3 MIL/uL (ref 4.0–6.0)
RDW: 14.7 % (ref 0.0–15.0)
WBC: 7.6 10*3/uL (ref 3.5–11.0)

## 2021-11-12 LAB — PSA (EFF.4-2010): PSA (eff. 4-2010): 3.47 ng/mL (ref 0.00–4.00)

## 2021-11-12 LAB — URINALYSIS WITH REFLEX TO MICROSCOPIC
Blood,UA: NEGATIVE
Glucose,UA: NEGATIVE
Ketones, UA: NEGATIVE
Leuk Esterase,UA: NEGATIVE
Nitrite,UA: NEGATIVE
Protein,UA: NEGATIVE
Specific Gravity,UA: 1.009 (ref 1.002–1.030)
pH,UA: 7 (ref 5.0–8.0)

## 2021-11-12 LAB — LIPID PANEL
Chol/HDL Ratio: 4
Cholesterol: 237 mg/dL — AB
HDL: 60 mg/dL (ref 40–60)
LDL Calculated: 148 mg/dL — AB
Non HDL Cholesterol: 177 mg/dL
Triglycerides: 145 mg/dL

## 2021-11-12 LAB — URIC ACID: Urate: 4.7 mg/dL (ref 3.9–9.0)

## 2021-11-13 LAB — HEMOGLOBIN A1C: Hemoglobin A1C: 5.9 % — ABNORMAL HIGH

## 2021-12-25 ENCOUNTER — Ambulatory Visit
Admission: RE | Admit: 2021-12-25 | Discharge: 2021-12-25 | Disposition: A | Discharge status: Home / Self Care | Payer: BLUE CROSS/BLUE SHIELD | Source: Ambulatory Visit | Attending: Primary Care | Admitting: Primary Care

## 2021-12-25 ENCOUNTER — Other Ambulatory Visit: Payer: Self-pay

## 2021-12-25 DIAGNOSIS — N281 Cyst of kidney, acquired: Secondary | ICD-10-CM | POA: Insufficient documentation

## 2021-12-28 ENCOUNTER — Encounter: Payer: Self-pay | Admitting: Primary Care

## 2022-02-17 ENCOUNTER — Encounter: Payer: Self-pay | Admitting: Gastroenterology

## 2022-02-17 LAB — HM COLONOSCOPY

## 2022-02-18 ENCOUNTER — Encounter: Payer: Self-pay | Admitting: Primary Care

## 2022-02-21 ENCOUNTER — Other Ambulatory Visit: Payer: Self-pay | Admitting: Primary Care

## 2022-02-21 NOTE — Telephone Encounter (Signed)
Last appt:08/22/2021    Next appt:03/06/2022

## 2022-03-06 ENCOUNTER — Encounter: Payer: Self-pay | Admitting: Primary Care

## 2022-03-06 ENCOUNTER — Other Ambulatory Visit
Admission: RE | Admit: 2022-03-06 | Discharge: 2022-03-06 | Disposition: A | Discharge status: Home / Self Care | Payer: BLUE CROSS/BLUE SHIELD | Source: Ambulatory Visit | Attending: Primary Care | Admitting: Primary Care

## 2022-03-06 ENCOUNTER — Other Ambulatory Visit: Payer: Self-pay

## 2022-03-06 ENCOUNTER — Ambulatory Visit: Payer: BLUE CROSS/BLUE SHIELD | Admitting: Primary Care

## 2022-03-06 VITALS — BP 138/76 | HR 89 | Ht 68.0 in | Wt 178.4 lb

## 2022-03-06 DIAGNOSIS — M109 Gout, unspecified: Secondary | ICD-10-CM | POA: Insufficient documentation

## 2022-03-06 DIAGNOSIS — N281 Cyst of kidney, acquired: Secondary | ICD-10-CM

## 2022-03-06 DIAGNOSIS — Z0000 Encounter for general adult medical examination without abnormal findings: Secondary | ICD-10-CM

## 2022-03-06 LAB — URIC ACID: Urate: 4.8 mg/dL (ref 3.9–9.0)

## 2022-03-07 ENCOUNTER — Other Ambulatory Visit: Payer: Self-pay

## 2022-03-07 ENCOUNTER — Encounter: Payer: Self-pay | Admitting: Primary Care

## 2022-03-07 MED ORDER — COLCHICINE-PROBENECID 0.5-500 MG PO TABS *A*
ORAL_TABLET | ORAL | 0 refills | Status: DC
Start: 2022-03-07 — End: 2022-05-14

## 2022-03-07 NOTE — Progress Notes (Signed)
He describes episodes of gout recently with uric acid 4.7 mg/dL.  Colchicine promptly taken remarkably effective in relieving symptoms.         Medical history remarkable for tonsillectomy vasectomy dysplastic nevi kidney stone gout     Family history reviewed negative for colon and prostate cancer. One grandparent had diabetes. Father died of suicide at age 64. Brother has cerebral palsy and hypertension. No family history of colon polyps or aneurysm. Mother's sister had brain cancer. Some skin cancers in a [place] relative. 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 Married, 2 children grown, never smoked, negligible alcohol at present.  Walking (45 minutes 3 times per week ?)     Medications reviewed and listed below     Review of systems unrevealing       BP 138/76 (BP Location: Left arm, Patient Position: Sitting, Cuff Size: adult)   Pulse 89   Ht 1.727 m (5\' 8" )   Wt 80.9 kg (178 lb 6.4 oz)   SpO2 99%   BMI 27.13 kg/m        HEENT exam is normal.  Neck and thyroid normal.  Chest clear to auscultation   Heart regular without murmur.  Abdomen without tenderness mass organomegaly.  Genital exam is normal.  Circumcised.  He has some dysplastic nevi and seborrheic keratoses.  He has no lymphadenopathy or edema.  Neurologic and musculoskeletal exams are normal.  Pedal pulses 2+.       Impression-  Hyperlipidemia,  normal carotid duplex 2021, LDL 148   Overweight HbA1c 5.9% previously higher at greater weight; further weight reduction would be helpful  Health maintenance.   Immunizations up to date.    Gout, repeat uric acid, taking allopurinol, if uric acid level is less than 5, consult rheumatology  Renal cyst, discussed recent ultrasound, plan to repeat ultrasound 1 year    Current Outpatient Medications   Medication Sig    predniSONE (DELTASONE) 20 mg tablet TAKE 1 TABLET BY MOUTH EVERY DAY    allopurinol  (ZYLOPRIM) 300 mg tablet Take 1 tablet (300 mg total) by mouth daily    colchicine-probenecid (COLBENEMID) 0.5-500 MG per tablet Take two tablets then one two hours later

## 2022-03-07 NOTE — Telephone Encounter (Signed)
Last appt:03/06/2022    Next appt:Visit date not found

## 2022-03-09 ENCOUNTER — Encounter: Payer: Self-pay | Admitting: Primary Care

## 2022-03-09 DIAGNOSIS — M109 Gout, unspecified: Secondary | ICD-10-CM

## 2022-03-20 ENCOUNTER — Other Ambulatory Visit: Payer: Self-pay | Admitting: Primary Care

## 2022-03-20 MED ORDER — PREDNISONE 20 MG PO TABS *I*
20.0000 mg | ORAL_TABLET | Freq: Every day | ORAL | 0 refills | Status: DC
Start: 2022-03-20 — End: 2022-07-21

## 2022-03-20 NOTE — Telephone Encounter (Signed)
Last appt:03/06/2022    Next appt:Visit date not found

## 2022-05-13 ENCOUNTER — Other Ambulatory Visit: Payer: Self-pay

## 2022-05-14 NOTE — Telephone Encounter (Signed)
Last appt:03/06/2022    Next appt:Visit date not found

## 2022-07-03 ENCOUNTER — Other Ambulatory Visit: Payer: Self-pay | Admitting: Family Medicine

## 2022-07-03 NOTE — Telephone Encounter (Signed)
Last appt:03/06/2022    Next appt:Visit date not found

## 2022-07-09 ENCOUNTER — Telehealth: Payer: Self-pay | Admitting: Primary Care

## 2022-07-09 NOTE — Telephone Encounter (Signed)
This is the 1st communication regarding radiology follow-up.    I received a notification from the Department of Radiology that [name] is due for the test below    Patient Name Kenneth Proctor   MRN [id number]   Date of Birth 12/17/1960   Followup Exam Recommended US Renal - 3-6 months  Recommendation due on: 06/23/2022  Read by: Madaline Guthrie IAN  Impression: Right renal cyst measures up to 2.3 cm with septation and suspected bridging vessel. This previously measured up to 1.9 cm. Recommend follow-up ultrasound in 3 to 6 months., END OF IMPRESSION,       , [facility] submits this DICOM format image data and final report to the [facility], an independent secure electronic health information exchange, on a reciprocally searchable basis (with patient authorization) for a minimum of 12 months after exam , date.            Please advise if this study is appropriate.     If so, please place the order and communicate to both myself and the appropriate practice staff so they may contact the patient.    The patient's next appointment is Visit date not found           Rita Ohara  Data Analyst

## 2022-07-21 ENCOUNTER — Encounter: Payer: Self-pay | Admitting: Primary Care

## 2022-07-21 ENCOUNTER — Other Ambulatory Visit: Payer: Self-pay | Admitting: Primary Care

## 2022-07-21 MED ORDER — PREDNISONE 20 MG PO TABS *I*
20.0000 mg | ORAL_TABLET | Freq: Every day | ORAL | 0 refills | Status: DC
Start: 2022-07-21 — End: 2023-03-17

## 2022-07-21 NOTE — Telephone Encounter (Signed)
Last appt:03/06/2022    Next appt:Visit date not found

## 2022-07-22 ENCOUNTER — Other Ambulatory Visit: Payer: Self-pay | Admitting: Rheumatology

## 2022-07-22 DIAGNOSIS — M109 Gout, unspecified: Secondary | ICD-10-CM

## 2022-07-22 NOTE — Provider Consult (Signed)
Consulting Provider Response     Question:  Management of gout     Recommendations:  Convert to referral    Rationale:  Assessment by Rheumatologist required    Contingency:  N/A    Time Spent: 5 mins      This eConsult is focused on the specific clinical question(s) asked by the referring clinician, is based on the clinical data available to me, the consulting physician at the time of the request, and is furnished without benefit of a comprehensive evaluation of physical examination of the patient by me. The guidance set forth in the eConsult note will need to be interpreted in light of any clinical issues not known to me or any changes in patient status that I may not be aware of at the time of filing this eConsult. If further consultation is necessary, an in-person visit with me or another member of our group is an option.

## 2022-07-27 ENCOUNTER — Other Ambulatory Visit: Payer: Self-pay | Admitting: Primary Care

## 2022-07-28 NOTE — Telephone Encounter (Signed)
Last appt:03/06/2022    Next appt:Visit date not found

## 2022-08-05 ENCOUNTER — Other Ambulatory Visit: Payer: Self-pay | Admitting: Primary Care

## 2022-08-05 NOTE — Telephone Encounter (Signed)
Last appt:03/06/2022    Next appt:Visit date not found

## 2022-08-06 ENCOUNTER — Encounter: Payer: Self-pay | Admitting: Primary Care

## 2022-08-21 ENCOUNTER — Telehealth: Payer: Self-pay | Admitting: Gastroenterology

## 2022-08-21 NOTE — Telephone Encounter (Unsigned)
Copied from CRM #[id number]. Topic: Appointments - Schedule Appointment  >> Aug 21, 2022  4:02 PM Seward Speck D wrote:  Patient is calling in to schedule his NPV in Rheumatology.  Insurance and demographics verified and patient scheduled with NP Mita Moorthi on 09/10/22 at [facility].  If necessary, patient can be reached at [phone] .

## 2022-09-10 ENCOUNTER — Other Ambulatory Visit: Payer: Self-pay

## 2022-09-10 ENCOUNTER — Ambulatory Visit: Payer: BLUE CROSS/BLUE SHIELD | Attending: Immunology | Admitting: Rheumatology

## 2022-09-10 DIAGNOSIS — L989 Disorder of the skin and subcutaneous tissue, unspecified: Secondary | ICD-10-CM

## 2022-09-10 DIAGNOSIS — R030 Elevated blood-pressure reading, without diagnosis of hypertension: Secondary | ICD-10-CM

## 2022-09-10 DIAGNOSIS — M109 Gout, unspecified: Secondary | ICD-10-CM

## 2022-09-10 DIAGNOSIS — Z79899 Other long term (current) drug therapy: Secondary | ICD-10-CM

## 2022-09-10 NOTE — Progress Notes (Unsigned)
Rheumatology New Patient Consult Note    PRIMARY CARE PHYSICIAN:  Horald Chestnut, MD  REFERRING PHYSICIAN:  Horald Chestnut, MD    Referral information: No specialty comments available.     CHIEF COMPLAINT:  gout    HPI:   Kenneth Proctor is a 62 y.o. year old Caucasian male who is referred for evaluation of gout    Patient reports stable health other than gout diagnosis and Kidney stone once without recurrence around 2020.    First attack ~ 5 yrs ago- right toe. Often time same joint. 1-2 episode along the small metatarsal.   No normal diagnosis through fluid analysis but by history and presentation.   He reports it's been managed fairly well until 2023 more getting more painful attack and somewhat duration.  He notes in late 2023 with the Holidays he got a couple of gout attack.  After the New year worst attack running series for 2-3 weeks    Reports taking Colchicine initially when with flares and then later on started Allopurinol 300 mg . Reports compliance with allopurinol daily.  Also avoidance of beer and dietary changes.  More recently Feb 2024- PCP suggested try avoid red meat, shellfish-  Reports from then until earlyJune 2024 no single attack.    Prior to travel to [place] in June had a flare.  Colchicine 0.6 mg  1 tab every other day  At times but rarely mostly when abroad he taken prednisone for couple of days.    Most recent mini-attack Monday 2-days ago  Took colchicine 0.6 mg took 1 tablet three time daily.  He denies any GI issues with colchicine.    Other joints involvement/concern  Right lateral foot- 5th toe  Denies wrists/hand joints stiffness or swelling    Treatment:  At times OTC NSAIDs (rarely)  Allopurinol 300mg   Colchicine: acute action plan    No family h/o gout  No RA. No psoriasis.    Kenneth Proctor is willing to be contacted regarding research participation:  not asked .    PAST MEDICAL HISTORY:  There is no problem list on file for this patient.    No past medical history on  file.  Past Surgical History:   Procedure Laterality Date    HX TONSILLECTOMY/ADENOIDECTOMY      Tonsillectomy Conversion Data     VASECTOMY      Surgery Of Male Genitalia Vasectomy Conversion Data        CURRENT MEDICATIONS:   Outpatient Encounter Medications as of 09/10/2022   Medication Sig Dispense Refill    allopurinol (ZYLOPRIM) 300 mg tablet TAKE 1 TABLET BY MOUTH EVERY DAY 90 tablet 1    colchicine-probenecid (COLBENEMID) 0.5-500 MG per tablet TAKE 2 TABLETS BY MOUTH AS ONE DOSE THEN TAKE 1 TABLET BY MOUTH TWO HOURS LATER 60 tablet 0    predniSONE (DELTASONE) 20 mg tablet Take 1 tablet (20 mg total) by mouth daily. 30 tablet 0     No facility-administered encounter medications on file as of 09/10/2022.         ALLERGIES: No known drug allergy    FAMILY HISTORY: No family history on file.  SOCIAL HISTORY:   Social History     Socioeconomic History    Marital status: Married   Tobacco Use    Smoking status: Never    Smokeless tobacco: Never   Substance and Sexual Activity    Alcohol use: Yes     Alcohol/week: 4.0 standard drinks of  alcohol     Types: 2 Cans of beer, 2 Glasses of wine per week     Comment: weekly    Drug use: No         REVIEW of SYSTEMS:  Rheumatology Review of Systems:    PHYSICAL EXAMINATION:  BP 165/78   Pulse 84   Temp 35.6 C (96.1 F) (Temporal)   Ht 1.727 m (5\' 8" )   Wt 82.5 kg (181 lb 14.4 oz)   SpO2 97%   BMI 27.66 kg/m   Body mass index is 27.66 kg/m.    Pain    09/10/22 1403   PainSc:   0 - No pain      Constitutional:    Well-developed and well-nourished.     In no distress.    Eyes:    Conjunctivae appear normal.      Conjunctivae are normal.     Cardiovascular:    Intact distal pulses.    Pulmonary:    Effort is normal.    Skin:    He has no erythema, no rash and normal nails.    Extremities:    There is no edema and no cyanosis of the extremities.     Musculoskeletal Exam:   Upper and lower extremities  There is peripheral joint tenderness (mild TTP over medial Right MTP 1)  and peripheral joint deformity (high arch with foot rolling inward, Rt toe Bunionette). There is no abnormal or restricted ROM, no pain with ROM, no proximal muscle weakness and a normal gait.                    PRIOR STUDIES:   Labs: - Recent labs reviewed -- please see ERecord results review section for values.  Hematology:       Lab results: 11/12/21  1405   WBC 7.6   Hemoglobin 14.6   Hematocrit 45   Platelets 228   MCV 85      - Chemistries       Lab results: 11/12/21  1405   Sodium 144   Potassium 4.3   CO2 27   Chloride 103   UN 13   Creatinine 0.99   Glucose 102*   Calcium 9.4   Total Protein 7.0   Albumin 4.6   Bilirubin,Total 0.4   AST 33   ALT 28   Alk Phos 84      - Other   No results for input(s): "ESR", "CRP", "FER", "CK", "DNAN", "C3", "C4", "ALDO" in the last 8760 hours.   - Urine studies       Lab results: 11/12/21  1405   Color, UA Yellow   Specific Gravity,UA 1.009   Leuk Esterase,UA NEG   Protein,UA NEG   Blood,UA NEG   Ketones, UA NEG      - Rheum serologies   No results for input(s): "ANAS", "ANAT", "ANAP", "DNAN", "SSA", "SSB", "ANRNP", "ANSM", "RIBOS", "HSTO", "SCL70", "CENTB", "JO1AB", "KUAAB", "EJAAB", "OJAAB", "PL7AB", "SRPAB", "RFD", "RFQ", "CCP", "HLB27", "HLB5", "B2IGG", "B2IGM", "ACLIG", "ACLIM", "LUPR", "ANCAS", "ANCAT", "ANCAP", "MPO", "PR3", "GBMAB" in the last 8760 hours.   - Micro serologies -   No results for input(s): "LYM", "LYMNT", "C6PEP", "ASOS", "ASOT", "DNSB", "PGAB", "PMAB", "EBVG", "EBVM", "EBNA", "CMVG", "CMVM" in the last 8760 hours.   Radiology: -Recent imaging reports reviewed -- please see ERecord imaging section for details.    IMPRESSION:  1. Gout, unspecified cause, unspecified chronicity, unspecified site        2. On  allopurinol therapy        3. Skin lesion of right lower limb  AMB eConsult to Adult Dermatology      4. On colchicine therapy        5. Elevated blood pressure reading            62 yoM with history of gout diagnosed based on history and symptoms  (`2019) who presents for initial evaluation in the setting of more recurrent "mini- flare" in past couple of months as well as 2-days prior to this visit. Exam without active gout today but has tenderness over the right medial metatarsal which has been his primary involved joint. History without other joints issues or family history or auto-immune disease to raise concern of other inflammatory condition. As a skin lesion over right shin per patient that grew bigger this year.    #1 Gout  Reviewed about gout etiology and its management both pharmacological and non-pharmacological through diet and lifestyle habits which he reports adherence.-recommended to work on his hydration.  Reviewed action plan for treatment of acute flare to consist of (Colcrys): 1.2 mg(2 TABS of 0.6 mg each) at first sign of flare, then 0.6 mg 1 hour later; not to exceed 1.8 mg(3 TABS) in 1-hr period  For prophylaxis(in the setting some indulging):  1 TABLET 0.6 mg PO qDay or q12hr; not to exceed 1.2 mg/day; after gout flare, wait 12 hr to continue prophylaxis  Recommended increase hydration-water at least 8 cups of 8 ounces.  Recommended checking out the D.A.S.H that works best for gout and blood pressure.    #2 elevated B/P  Noted today and previous in-clinic entry on E-record. Pe patient this has been discussed before with him per his primary and recommended home B/P cuff as well.   Recommend the angiotensin II receptor blocker losartan lowers serum concentration of urate in hypertensive subjects through a significant uricosuric action.  Reviewed all previous labs with stable kidney function test and eGFR except on two occasions with mild increased of serum creatinine. Reviewed foot imaging without erosions given the episodic gouty attack.    #3 skin lesion on right mid-shin  Shared decision to get lesion checked through [facility] with dermatology.    Plan  -will obtain MSK [place] of right foot to evaluate any gout burden or structural changes  .  -will check cmp, cbc/diff ,serum urate, crp, sed  rate.  -E-consult to derm sent.  Continue allopurinol 300 mg daily- if serum urate level remains < 5 mg/dl consider taking  tablet (150 mg) daily  -Agreed to leave future appointment with Rheum open.    Will call or Mychart for MSK [place] result.        Patient Instructions

## 2022-09-10 NOTE — Patient Instructions (Signed)
Nice to meet you!

## 2022-09-11 ENCOUNTER — Other Ambulatory Visit: Payer: BLUE CROSS/BLUE SHIELD | Admitting: Dermatology

## 2022-09-11 ENCOUNTER — Ambulatory Visit: Payer: BLUE CROSS/BLUE SHIELD

## 2022-09-11 DIAGNOSIS — R21 Rash and other nonspecific skin eruption: Secondary | ICD-10-CM

## 2022-09-11 DIAGNOSIS — M79671 Pain in right foot: Secondary | ICD-10-CM

## 2022-09-11 DIAGNOSIS — M7989 Other specified soft tissue disorders: Secondary | ICD-10-CM

## 2022-09-11 NOTE — Procedures (Signed)
Musculoskeletal ultrasound examination of the right forefoot.    Indication: History of pain and swelling.  Evaluate for synovitis or crystal deposition disease.    High-frequency ultrasonography at 15 MHz is used.  Gray scale and power Doppler studies are obtained.  Studies are performed on a GE Logiq E10 unit.  Bony structures, cartilage, joint recess and tendons are visualized.    Findings: Metatarsophalangeal joints 1 through 5 are visualized from a dorsal aspect.  In addition, MTP 1 is visualized from the medial aspect and MTP 5 from a lateral aspect.  MTP joints 1 and 2 are also visualized from a plantar aspect.    No increased fluid collection is seen in the examined joint areas.  There is a small amount of fluid within the dorsal recess of the first MTP, technically within physiologic limits.  There is some hyperechoic speckling suggestive of crystalline deposition.  A well-corticated moderate-sized erosion is noted at the medial aspect of the first MTP.  No definite crystalline deposition is noted.  No sonographic double contour sign is seen.  No abnormal power Doppler signal is noted.  Bipartite medial sesamoid is noted at the plantar aspect of the foot (also seen on plain radiographs of 07/12/2021).    Impression: Possible crystalline deposition within the first MTP synovium.  Erosion of the medial aspect of the first metatarsal head.  Findings are most consistent with treated gout, but no specific findings of crystalline arthropathy were definitively seen.  Bipartite medial sesamoid is also noted, this can be an anatomic variant.    Kenneth Proctor, MBBS, MSc, RhMSUS   Rheumatology  10:37 AM   09/11/2022    Portions of this note were created using [facility] dictation software.  Despite all efforts to catch spelling errors or errors in transcription, errors may still occur.  If there is a concern regarding a possible error in this note, please contact my office.

## 2022-09-11 NOTE — Provider Consult (Signed)
Dermatology E-Consult    Question/History  Dermatology Spot / Skin growth     I am requesting an e-Consult for my patient, Kenneth Proctor, a 62 y.o. year old male with skin lesion on right lower shin- per patient it has increase in the past year. Seen for new patient visit for gout.     MY CLINICAL QUESTION:  Evaluate skin lesion of lower shin     Location: on right lower shin(part of body)   First noticed: small for a couple of years increase in the past year   Changes since include: increased in size, not itchy, no bleeding(itching, bleeding, pain, other?)   History of skin cancer: No   History of immunosuppression: No     Photographs  Reviewed in Media Tab    Good    Please visit the following website to see an excellent and concise guide for taking medical photographs:  [email or web address]      Assessment  Rash of uncertain etiology. DDx includes non-melanoma skin cancer (NMSC), nummular dermatitis, necrobiosis lipoidica, inflamed seborrheic keratosis, other      Plan  - start triamcinolone 0.1% ointment or cream (patient preference) BID x 4 weeks; if improving use until clear and then prn    - recommend wearing SPF 50+ sunscreen and sun protective clothing  - in-person dermatology consultation, possible biopsy      - Please do not place a separate referral to Dermatology. This eConsult serves as a referral for this condition for up to 1 year (if needed).      Triage/Scheduling Instructions for [facility] Referral Coordinators  Recommend Friday Single-Lesion Clinic    Please call and schedule appointment in the next available Friday eConsult Single Lesion clinic. If no availability within 8 weeks, please call and schedule semi-urgent appointment (NPV block, or Urgent if no NPV available) in ~8 weeks with: MD only.           Time Spent: 5 minutes    This eConsult is focused on the specific clinical question(s) asked by the referring clinician, is based on the  clinical data available to me, the consulting physician at the time off the request, and is furnished without benefit of a comprehensive evaluation of physical examination of the patient by me. The guidance set forth in the eConsult note will need to be interpreted in light of any clinical issues not known to me or any changes in patient status that I may not be aware of at the time of filing this eConsult. If further consultation is necessary, an in-person visit with me or another member of our group is an option.    Woodfin Ganja, MD  Dermatology Attending

## 2022-09-12 ENCOUNTER — Other Ambulatory Visit: Payer: BLUE CROSS/BLUE SHIELD | Admitting: Rheumatology

## 2022-09-12 ENCOUNTER — Encounter: Payer: Self-pay | Admitting: Rheumatology

## 2022-09-12 DIAGNOSIS — L989 Disorder of the skin and subcutaneous tissue, unspecified: Secondary | ICD-10-CM

## 2022-09-12 MED ORDER — TRIAMCINOLONE ACETONIDE 0.1 % EX CREA *I*
TOPICAL_CREAM | Freq: Two times a day (BID) | CUTANEOUS | 1 refills | Status: DC
Start: 2022-09-12 — End: 2023-03-17

## 2022-09-12 NOTE — Progress Notes (Signed)
Ordering Provider Response         Triamcinolone 0.1% cream to apply twice daily x 4 weeks ordered as per recommendation from E-consult for right lower shin lesion. ,NP

## 2022-10-19 ENCOUNTER — Other Ambulatory Visit: Payer: Self-pay | Admitting: Primary Care

## 2022-10-19 ENCOUNTER — Encounter: Payer: Self-pay | Admitting: Primary Care

## 2022-10-19 DIAGNOSIS — N281 Cyst of kidney, acquired: Secondary | ICD-10-CM

## 2022-10-19 DIAGNOSIS — M109 Gout, unspecified: Secondary | ICD-10-CM

## 2022-10-23 ENCOUNTER — Ambulatory Visit: Payer: BLUE CROSS/BLUE SHIELD | Attending: Dermatology

## 2022-10-23 ENCOUNTER — Encounter: Payer: Self-pay | Admitting: Gastroenterology

## 2022-10-23 ENCOUNTER — Other Ambulatory Visit: Payer: Self-pay

## 2022-10-23 DIAGNOSIS — D485 Neoplasm of uncertain behavior of skin: Secondary | ICD-10-CM | POA: Insufficient documentation

## 2022-10-23 DIAGNOSIS — D229 Melanocytic nevi, unspecified: Secondary | ICD-10-CM | POA: Insufficient documentation

## 2022-10-23 DIAGNOSIS — L821 Other seborrheic keratosis: Secondary | ICD-10-CM | POA: Insufficient documentation

## 2022-10-23 NOTE — Progress Notes (Addendum)
Dermatology Progress Note    Chief Complaint   Patient presents with    New Patient Visit     Spot of concern       Derm History and Relevant Medical History:   - No known personal history of skin cancer  - Maternal aunt may have had skin cancer      HPI:  Kenneth Proctor is a 62 y.o. male new patient who presents for the following concern:  spot on right shin. Has been there many years. Does not come and go. It is asymptomatic.  Maybe a bit bigger recently. The patient has previously tried triamcinolone cream with no benefit. Wondering what this is and if management needs to be done. Also wondering about other brown spots on left lower leg.  ?  Social History:  - Occupation:  Pensions consultant on [employer]  - Smoking:  Never       Physical Exam:   Skin: All of the following were examined, and were within normal limits, except as noted: right shin, back, and left lower leg  - Waxy yellow-brown thin plaque on right shin  - Scattered on the back and left lower leg there are multiple stuck on appearing brown papules and plaques.  - Scattered on the back there are well defined, symmetric, light to dark brown macules and papules with regular borders and even pigmentation                Assessment/Plan:    Neoplasm of uncertain behavior - right shin, 3.0 cm x 1.8 cm, ddx includes SK vs NLD vs SCC  - Differential diagnosis and treatment options discussed, photo taken today  - 3-0 punch biopsy today to assist with dx, procedure note below  - Wound care instructions discussed: petrolatum jelly and band aid  - Management based on pathology results     Seborrheic keratoses  - back, left lower leg  - benign lesions due to proliferation of epidermis, not cancer, no treatment necessary      Multiple benign nevi  - back  - benign lesions, no treatment warranted  - checking nevi for ABCDEs is advised  - sun avoidance from the hours of 10am-4pm, protective clothing, and the use of a broad spectrum sunscreen of at least SPF 30 is  recommended        Return to clinic: pending biopsy results    Geraldine Contras, MD, Dermatology Resident  Patient seen, examined, and discussed with supervising attending physician Dr. Robbie Louis      ____________________________________________    Punch Biopsy    Time Out:  Preprocedure verification conducted prior to time out: Yes   Consent obtained: Yes  Correct Patient (Use of 2 identifiers):  Yes   Correct Procedure: Yes   Time out completed at: 10:31 AM  Correct Site:  Yes   Site Marked: Yes    Procedure Note:  - Site: right shin  - After informed consent was obtained, including a discussion of the risk for bleeding, infection, incomplete removal, scarring, and recurrence/persistence, the lesional area was prepped with hibiclens prior to infiltration with 1% lidocaine with epinephrine.  After waiting several minutes for epinephrine effect and that adequate anesthesia was achieved, the lesion was biopsied with a 3 mm punch.  Hemostasis was obtained.  There were no complications.  The biopsy was placed in formalin and sent for routine histopathological evaluation. The wound was then dressed with sterile petrolatum and a sterile dressing.  Wound care instructions were  discussed.  Patient is to contact 24-hour contact number in case of emergency. The patient will be notified by clinical staff regarding the biopsy result and the need for further management.    A 5-0 Fast Absorbing Gut suture was placed in a figure-of-eight fashion to close the site. The patient was instructed that this suture will self-dissolve.    I saw and evaluated the patient with resident/fellow. I agree with the history, findings and plan of care as documented above.  I was present for key portions of procedure.    Jolene Provost, MD

## 2022-10-30 NOTE — Telephone Encounter (Signed)
Pt was read my chart message and he verbalized understanding, already has appointment for ultrasound. Call transferred to schedule physical for pt.

## 2022-10-31 LAB — SURGICAL PATHOLOGY

## 2022-10-31 NOTE — Result Encounter Note (Signed)
Pathology report reviewed, result benign Seborrheic Keratosis. Will message pt regarding result.  CC Dr. Robbie Louis

## 2022-11-06 ENCOUNTER — Other Ambulatory Visit: Payer: Self-pay | Admitting: Primary Care

## 2022-11-06 NOTE — Telephone Encounter (Signed)
Last office visit:   03/06/2022  Last telehome visit:   Visit date not found  Patients upcoming appointments:  Future Appointments   Date Time Provider Department Center   12/03/2022  8:30 AM RIS, RR US8 (RRUS8) [facility]   03/16/2023  4:00 PM Horald Chestnut, MD [facility] None     BP Readings from Last 3 Encounters:   09/10/22 165/78   03/06/22 138/76   08/22/21 140/88      Recent Lab results:  GENERAL CHEMISTRY   Recent Labs     03/06/22  1403 11/12/21  1405   NA  --  144   K  --  4.3   CL  --  103   CO2  --  27   GAP  --  14   UN  --  13   CREAT  --  0.99   GLU  --  102*   CA  --  9.4   URIC 4.8 4.7      LIPID PROFILE   Recent Labs     11/12/21  1405   CHOL 237*   TRIG 145   HDL 60   LDLC 148*      LIVER PROFILE   Recent Labs     11/12/21  1405   ALT 28   AST 33   ALK 84   TB 0.4      DIABETES THYROID   Recent Labs     11/12/21  1405   HA1C 5.9*    No value within the past 365 days      Pending/Orders Labs:  Lab Frequency Next Occurrence   Hemoglobin A1c Once 10/19/2022   CBC Once 10/19/2022   Comprehensive metabolic panel Once 10/19/2022   Lipid Panel (Reflex to Direct  LDL if Triglycerides more than 400) Once 10/19/2022   Urinalysis with reflex to microscopic Once 10/19/2022   PSA (eff.05-2008) Once 10/19/2022   Uric acid Once 10/19/2022   US renal retroperitoneal limited Once 11/18/2022

## 2022-12-03 ENCOUNTER — Ambulatory Visit
Admission: RE | Admit: 2022-12-03 | Discharge: 2022-12-03 | Disposition: A | Discharge status: Home / Self Care | Payer: BLUE CROSS/BLUE SHIELD | Source: Ambulatory Visit | Attending: Primary Care | Admitting: Primary Care

## 2022-12-03 ENCOUNTER — Other Ambulatory Visit: Payer: Self-pay

## 2022-12-03 DIAGNOSIS — N281 Cyst of kidney, acquired: Secondary | ICD-10-CM | POA: Insufficient documentation

## 2022-12-08 ENCOUNTER — Encounter: Payer: Self-pay | Admitting: Primary Care

## 2023-01-20 ENCOUNTER — Other Ambulatory Visit: Payer: Self-pay | Admitting: Primary Care

## 2023-01-20 NOTE — Telephone Encounter (Signed)
Last appt:03/06/2022    Next appt:03/16/2023

## 2023-02-04 ENCOUNTER — Other Ambulatory Visit: Payer: Self-pay

## 2023-02-10 ENCOUNTER — Other Ambulatory Visit: Payer: Self-pay

## 2023-02-10 ENCOUNTER — Encounter: Payer: Self-pay | Admitting: Primary Care

## 2023-02-10 ENCOUNTER — Ambulatory Visit: Payer: BLUE CROSS/BLUE SHIELD | Admitting: Primary Care

## 2023-02-10 VITALS — BP 166/70 | HR 63 | Temp 97.4°F | Ht 68.0 in | Wt 188.7 lb

## 2023-02-10 DIAGNOSIS — R030 Elevated blood-pressure reading, without diagnosis of hypertension: Secondary | ICD-10-CM

## 2023-02-10 DIAGNOSIS — R059 Cough, unspecified: Secondary | ICD-10-CM

## 2023-02-10 MED ORDER — HYDROCODONE BIT-HOMATROP MBR 5-1.5 MG/5ML PO SOLN *I*
5.0000 mL | ORAL | 0 refills | Status: DC | PRN
Start: 2023-02-10 — End: 2023-03-17

## 2023-02-10 MED ORDER — AZITHROMYCIN 250 MG PO TABS *I*
ORAL_TABLET | ORAL | 0 refills | Status: AC
Start: 2023-02-10 — End: 2023-02-15

## 2023-02-10 NOTE — Progress Notes (Signed)
Beginning more than a week ago he had some cold symptoms without fever sweats chills headache body aches.  He has had cough and sometimes has dyspnea with cough.  He describes greenish secretions both nose and sputum.  His appetite is good.  Also describes elevated systolic blood pressures 150s 981X  BP 166/70 (BP Location: Right arm, Patient Position: Sitting, Cuff Size: large adult)   Pulse 63   Temp 36.3 C (97.4 F)   Ht 1.727 m (5\' 8" )   Wt 85.6 kg (188 lb 11.2 oz)   SpO2 99%   BMI 28.69 kg/m   On exam TMs are normal nasal passages mildly swollen mouth and throat normal chest clear without wheezing or expiratory prolongation.    Impression  Cough probably viral, could be mycoplasma.  Prescribed azithromycin and Hycodan syrup  Elevated blood pressure, bring home blood pressure device to appointment scheduled for beginning of February submit blood pressures to [employer]

## 2023-03-04 ENCOUNTER — Other Ambulatory Visit
Admission: RE | Admit: 2023-03-04 | Discharge: 2023-03-04 | Disposition: A | Discharge status: Home / Self Care | Payer: BLUE CROSS/BLUE SHIELD | Source: Ambulatory Visit | Attending: Primary Care | Admitting: Primary Care

## 2023-03-04 DIAGNOSIS — M109 Gout, unspecified: Secondary | ICD-10-CM | POA: Insufficient documentation

## 2023-03-04 LAB — COMPREHENSIVE METABOLIC PANEL
ALT: 31 U/L (ref 0–50)
AST: 35 U/L (ref 0–50)
Albumin: 4.5 g/dL (ref 3.5–5.2)
Alk Phos: 84 U/L (ref 40–130)
Anion Gap: 10 (ref 7–16)
Bilirubin,Total: 0.4 mg/dL (ref 0.0–1.2)
CO2: 30 mmol/L — ABNORMAL HIGH (ref 20–28)
Calcium: 9.7 mg/dL (ref 8.6–10.2)
Chloride: 102 mmol/L (ref 96–108)
Creatinine: 1.08 mg/dL (ref 0.67–1.17)
Glucose: 126 mg/dL — ABNORMAL HIGH (ref 60–99)
Lab: 14 mg/dL (ref 6–20)
Potassium: 4.2 mmol/L (ref 3.3–5.1)
Sodium: 142 mmol/L (ref 133–145)
Total Protein: 6.6 g/dL (ref 6.3–7.7)
eGFR BY CREAT: 76 *

## 2023-03-04 LAB — URINALYSIS WITH REFLEX TO MICROSCOPIC
Blood,UA: NEGATIVE
Glucose,UA: NEGATIVE
Ketones, UA: NEGATIVE
Leuk Esterase,UA: NEGATIVE
Nitrite,UA: NEGATIVE
Protein,UA: NEGATIVE
Specific Gravity,UA: 1.02 (ref 1.002–1.030)
pH,UA: 6 (ref 5.0–8.0)

## 2023-03-04 LAB — LIPID PANEL
Chol/HDL Ratio: 3.7
Cholesterol: 215 mg/dL — AB
HDL: 58 mg/dL (ref 40–60)
LDL Calculated: 136 mg/dL — AB
Non HDL Cholesterol: 157 mg/dL
Triglycerides: 117 mg/dL

## 2023-03-04 LAB — CBC
Hematocrit: 46 % (ref 37–52)
Hemoglobin: 14.6 g/dL (ref 12.0–17.0)
MCV: 86 fL (ref 75–100)
Platelets: 230 10*3/uL (ref 150–450)
RBC: 5.3 MIL/uL (ref 4.0–6.0)
RDW: 13.2 % (ref 0.0–15.0)
WBC: 4.7 10*3/uL (ref 3.5–11.0)

## 2023-03-04 LAB — HEMOGLOBIN A1C: Hemoglobin A1C: 5.9 % — ABNORMAL HIGH

## 2023-03-04 LAB — URIC ACID: Urate: 3 mg/dL — ABNORMAL LOW (ref 3.9–9.0)

## 2023-03-04 LAB — PSA (EFF.4-2010): PSA (eff. 4-2010): 3.65 ng/mL (ref 0.00–4.00)

## 2023-03-07 ENCOUNTER — Other Ambulatory Visit: Payer: Self-pay

## 2023-03-16 ENCOUNTER — Ambulatory Visit: Payer: BLUE CROSS/BLUE SHIELD | Admitting: Primary Care

## 2023-03-16 ENCOUNTER — Other Ambulatory Visit: Payer: Self-pay

## 2023-03-16 ENCOUNTER — Encounter: Payer: Self-pay | Admitting: Primary Care

## 2023-03-16 VITALS — BP 138/86 | HR 69 | Ht 68.0 in | Wt 183.8 lb

## 2023-03-16 DIAGNOSIS — Z0000 Encounter for general adult medical examination without abnormal findings: Secondary | ICD-10-CM

## 2023-03-16 DIAGNOSIS — M109 Gout, unspecified: Secondary | ICD-10-CM

## 2023-03-16 DIAGNOSIS — I10 Essential (primary) hypertension: Secondary | ICD-10-CM

## 2023-03-16 MED ORDER — IRBESARTAN 150 MG PO TABS *I*
150.0000 mg | ORAL_TABLET | Freq: Every day | ORAL | 3 refills | Status: DC
Start: 2023-03-16 — End: 2023-05-27

## 2023-03-17 ENCOUNTER — Encounter: Payer: Self-pay | Admitting: Primary Care

## 2023-03-17 NOTE — Progress Notes (Signed)
Gout has been quiescent taking allopurinol 300 mg daily and colchicine probenecid every other day          Medical history remarkable for tonsillectomy vasectomy dysplastic nevi kidney stone gout     Family history reviewed negative for colon and prostate cancer. One grandparent had diabetes. Father died of suicide at age 65. Brother has cerebral palsy and hypertension. No family history of colon polyps or aneurysm. Mother's sister had brain cancer. Some skin cancers in a [place] relative. 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 Married, 2 children grown, never smoked, occasional wine.  Walking 40 to 45 minutes 2 or 3 times per week     Medications reviewed and listed below     Review of systems unrevealing      BP 138/86 (BP Location: Left arm, Patient Position: Sitting, Cuff Size: adult)   Pulse 69   Ht 1.727 m (5\' 8" )   Wt 83.4 kg (183 lb 12.8 oz)   SpO2 99%   BMI 27.95 kg/m   MyChart home blood pressures show consistent systolic elevation  Simultaneous blood pressures left arm with his device 171/91, right arm usual method 168/88        HEENT exam is normal.  Neck and thyroid normal.  Chest clear to auscultation   Heart regular without murmur.  Abdomen without tenderness mass organomegaly.  Genital exam is normal.  Circumcised.  He has some dysplastic nevi and seborrheic keratoses.  He has no lymphadenopathy or edema.  Neurologic and musculoskeletal exams are normal.  Pedal pulses 2+.       Impression-  Hyperlipidemia, LDL 136 normal carotid duplex 2021, observe  Overweight HbA1c 5.9%   Health maintenance.   Immunizations adequate.  Laboratory reviewed.    Gout, controlled   Hypertension, prescribed irbesartan, submit six 5-minute resting blood pressures to MyChart for my comment  Renal cyst, stable, periodic surveillance completed     Recheck 1 year    Current Outpatient Medications   Medication Sig    irbesartan  (AVAPRO) 150 MG tablet Take 1 tablet (150 mg total) by mouth daily.    allopurinol (ZYLOPRIM) 300 mg tablet TAKE 1 TABLET BY MOUTH EVERY DAY    colchicine-probenecid (COLBENEMID) 0.5-500 MG per tablet TAKE 2 TABLETS BY MOUTH AS ONE DOSE THEN TAKE 1 TABLET TWO HOURS LATER

## 2023-03-30 ENCOUNTER — Encounter: Payer: Self-pay | Admitting: Primary Care

## 2023-04-03 ENCOUNTER — Encounter: Payer: Self-pay | Admitting: Primary Care

## 2023-04-04 ENCOUNTER — Other Ambulatory Visit: Payer: Self-pay

## 2023-04-20 ENCOUNTER — Other Ambulatory Visit: Payer: Self-pay | Admitting: Primary Care

## 2023-04-21 ENCOUNTER — Other Ambulatory Visit: Payer: Self-pay | Admitting: Primary Care

## 2023-04-21 ENCOUNTER — Encounter: Payer: Self-pay | Admitting: Primary Care

## 2023-04-21 MED ORDER — IRBESARTAN 300 MG PO TABS *I*
300.0000 mg | ORAL_TABLET | Freq: Every day | ORAL | 3 refills | Status: AC
Start: 2023-04-21 — End: ?

## 2023-04-21 NOTE — Telephone Encounter (Signed)
 Last appt: 03/16/2023     Next appt:  Visit date not found

## 2023-04-21 NOTE — Telephone Encounter (Signed)
 Last appt: 03/16/2023     Next appt: none

## 2023-05-27 ENCOUNTER — Encounter: Payer: Self-pay | Admitting: Family Medicine

## 2023-05-27 ENCOUNTER — Other Ambulatory Visit: Payer: Self-pay

## 2023-05-27 ENCOUNTER — Ambulatory Visit: Attending: Family Medicine | Admitting: Family Medicine

## 2023-05-27 VITALS — BP 140/90 | HR 78 | Temp 97.5°F | Ht 68.0 in | Wt 187.2 lb

## 2023-05-27 DIAGNOSIS — J069 Acute upper respiratory infection, unspecified: Secondary | ICD-10-CM

## 2023-05-27 DIAGNOSIS — I10 Essential (primary) hypertension: Secondary | ICD-10-CM | POA: Insufficient documentation

## 2023-05-27 NOTE — Progress Notes (Signed)
 [facility] Outpatient Progress Note:    Subjective:    Kenneth Proctor is a 63 y.o. male presenting for Follow-up, Cough, and URI      HPI:  Kenneth Proctor is a 63 year old male with PMH significant for hypertension and gout presenting for 1 week of cough and congestion.  He reports that last week it started with diarrhea for a few days and a low-grade fever.  It then progressed to sinus congestion, occasional cough, and left ear pressure for the last few days.  He tried NyQuil with positive effect on cough.  Does report intermittent nasal congestion for the last 1 to 2 months.    Hypertension  Patient increased his irbesartan  from 150 mg to 300 mg about a month ago.  He reports that he has not been checking his blood pressure at home, but he has a home cuff.  Blood pressure today is 140/90.    Objective:  BP 140/90 (BP Location: Left arm, Patient Position: Sitting, Cuff Size: adult)   Pulse 78   Temp 36.4 C (97.5 F)   Ht 1.727 m (5\' 8" )   Wt 84.9 kg (187 lb 3.2 oz)   SpO2 99%   BMI 28.46 kg/m     Vitals reviewed  General: Well-appearing, well-nourished, NAD   Eyes:  Conjunctiva clear, sclera anicteric  ENT: Hearing intact to conversational speech,  external ears normal in appearance, mild effusion of left ear.  Neck: No adenopathy, thyromegaly or nodularity, no extrathyroidal masses  CV:  RRR, S1/S2 normal without m/r/g, no LE edema  Resp:  Breathing comfortably on RA, CTAB  Neuro:  Alert, no gross focal motor deficits, fluent speech  MSK: Normal gait, no gross abnormalities or deformity    Assessment/Plan:  1. URI, acute (Primary)  - HPI and physical exam are consistent with an acute viral URI.  Patient is not interested in viral testing at this time.  - Recommend Mucinex 2 times daily as needed for cough and congestion.  - Tylenol/ibuprofen as needed for pain/fever.  - Flonase nasal spray once daily for nasal congestion and left ear effusion.  -Recommend OTC Zyrtec 10 mg to help with sinus  congestion and left ear effusion.  - Continue supportive care (rest, hydration, good hand hygiene, and tea with honey and lemon).  -Recommend seeking emergency care if he develops severe SOB unresolved with rest or difficulty breathing.    2. Essential hypertension, benign  -Blood pressure is elevated at today's visit (140/90).  Kenneth Proctor is agreeable to checking his blood pressure at home 3x weekly for 2 weeks and sending readings to PCP.  - He is not interested in making a follow-up appointment at this time.  He would prefer to continue over MyChart for now.    Follow up: Follow up if symptoms worsen or fail to improve.      Eileen Grate, FNP-C  [facility] Clinical Faculty, Department of [facility]  [facility]  [address], [place], [place] [address]  Phone: [phone]  Fax: [phone]      To my patients:  Some of my notes are dictated using voice-recognition program which may result in minor transcription errors.  If you have any urgent concerns, please contact me through MyChart.  Please bring any non-urgent concerns to your next appointment so we can discuss them.  Thank you!

## 2023-05-27 NOTE — Patient Instructions (Addendum)
 Encourage fluids, rest, & good hand hygiene.    Take over the counter meds as discussed.   Tylenol and Ibuprofen as needed for pain/fever.  Can take Mucinex to thin secretions.  Flonase nasal spray as needed for nasal congestion.  Zyrtec 10 mg once daily for ear pressure.

## 2023-10-26 ENCOUNTER — Other Ambulatory Visit: Payer: Self-pay | Admitting: Primary Care

## 2023-10-26 NOTE — Telephone Encounter (Signed)
 Last appt: 05/27/2023 Next appt:  11/05/2023

## 2023-11-05 ENCOUNTER — Ambulatory Visit: Payer: Medicare (Managed Care) | Admitting: Primary Care

## 2023-11-05 ENCOUNTER — Other Ambulatory Visit: Payer: Self-pay

## 2023-11-05 ENCOUNTER — Ambulatory Visit: Payer: Medicare (Managed Care) | Attending: Family Medicine | Admitting: Primary Care

## 2023-11-05 ENCOUNTER — Encounter: Payer: Self-pay | Admitting: Primary Care

## 2023-11-05 VITALS — BP 140/88 | HR 81 | Ht 68.0 in | Wt 189.6 lb

## 2023-11-05 DIAGNOSIS — M25511 Pain in right shoulder: Secondary | ICD-10-CM

## 2023-11-05 DIAGNOSIS — R2230 Localized swelling, mass and lump, unspecified upper limb: Secondary | ICD-10-CM

## 2023-11-05 DIAGNOSIS — Z0000 Encounter for general adult medical examination without abnormal findings: Secondary | ICD-10-CM

## 2023-11-05 NOTE — Progress Notes (Signed)
 Visit performed as:        Office Visit, met with patient in personToday we reviewed and updated Kenneth Proctor's smoking status, activities of daily living, depression screen, fall risk, medications and allergies. I have counseled the patient in the above areas. Subjective: Chief Complaint: Kenneth Proctor is a 63 y.o. male here for a/an Subsequent Annual Medicare Visit and Shoulder PainIn general, Kenneth Proctor rates their overall health jd:[id number] Care Team:Vukman, Kenneth NOVAK, MD as PCP - Kenneth Newman POUR, MD (Gastroenterology) Medications Ordered Prior to Encounter[1]Allergies[2]Patient Active Problem List  Diagnosis Date Noted  Essential hypertension, benign 05/27/2023 Past Medical History[3]Past Surgical History[4]Family History[5]Social History[6]Objective: Vital Signs: BP 140/88 (BP Location: Left arm, Patient Position: Sitting, Cuff Size: adult)   Pulse 81   Ht 1.727 m (5' 8)   Wt 86 kg (189 lb 9.6 oz)   SpO2 96%   BMI 28.83 kg/m  BMI: Body mass index is 28.83 kg/m.Vision Screening Results (Welcome visit only):No results found.Depression Screening Results:Review Flowsheet    11/05/2023 01/21/2021 12/23/2018 11/13/2017 07/23/2016 PHQ Scores PSQ2 Q1 - Interest/Pleasure - - N N N PSQ2 Q2 - Down, Depressed, Hopeless - - N - N PHQ Calculated Score 0 0 - - - No questionnaires on file. Opioid Use/DAST- 10 Screening Results: How many times in the past year have you used an illegal drug or used a prescription medication for nonmedical reasons?: 0 (11/05/2023  3:47 PM)Activities of Daily Living/Functional Screening Results:Is the person deaf or does he/she have serious difficulty hearing?: N (11/05/2023  3:47 PM)Is this person blind or does he/she have serious difficulty seeing even when wearing glasses?: N (11/05/2023  3:47 PM)Does this person have serious difficulty walking or climbing stairs?: N  (11/05/2023  3:47 PM)Does this person have difficulty dressing or bathing?: N (11/05/2023  3:47 PM)*Shopping: Independent (11/05/2023  3:47 PM)*House Keeping: Independent (11/05/2023  3:47 PM)*Managing Own Medications: Independent (11/05/2023  3:47 PM)*Handling Finances: Independent (11/05/2023  3:47 PM)Difficulty doing errands due to a physicial, mental or emotional condition: No (11/05/2023  3:47 PM)Difficulty remembering or making decisions due to a physicial, mental or emotional condition: No (11/05/2023  3:47 PM)Fall Risk Screening Results:Have you fallen in the last year?: No (11/05/2023  3:45 PM)Do you feel you are at risk for falling?: No (11/05/2023  3:45 PM)Assessment and Plan: Cognitive Function:Recall of recent and remote events appears:Normal  Advanced Care Planning:was discussed and patient received paperwork to review The following health maintenance plan was reviewed with the patient:Health Maintenance Topics with due status: Overdue   Topic Date Due  IMM-MMR (1-18 Kenneth Proctor or Born [id number]) Never done  HIV Screening [place] Never done  IMM Pneumo: 50+ Years Never done  IMM-Influenza 10/05/2023  COVID-19 Vaccine 10/05/2023 Health Maintenance Topics with due status: Not Due   Topic Last Completion Date  IMM DTaP/Tdap/Td 12/22/2019  Colon Cancer Screening Other 02/17/2022  Depression Screen Yearly 11/05/2023  Fall Risk Screening 11/05/2023 Health Maintenance Topics with due status: Completed   Topic Last Completion Date  Hepatitis C Screening [place]PSTF/[place] 12/13/2019  IMM-Zoster 01/21/2021 Health Maintenance Topics with due status: Aged [employer] Date Due  IMM-Hepatitis B Vaccine Aged Out  IMM-HIB 0-5 Yrs or At-Risk Patients Aged Out  IMM-HPV 9-26 Yrs or Shared Decision (27-45 Yrs) Aged Out  IMM-MCV4 0-18 Yrs or At-Risk Patients Aged Out  IMM-Rotavirus 0-8 Months Aged Out  IMM-MenB (2 Plans: Shared  decision & Increased Risk Plans) Aged Out This health maintenance schedule, identified risks, a list of orders  placed today and patient goals have been provided to Kenneth Proctor in the after visit summary. Plan for any concerns identified during screening or risk assessments:n/a-------------------------------------------------------------------------------------------------------[facility] Subjective [name] is a 63 y.o. male who presents for Subsequent Annual Medicare Visit and Shoulder PainHistory of Present IllnessThe patient is a 63 year old male presenting for an annual Medicare wellness visit and right shoulder pain.He reports good health and satisfactory memory. He completed a healthcare proxy form, likely at home. He is interested in influenza and COVID-19 vaccines but prefers to schedule them later.Right shoulder pain since 09/01/2023 after [place] lake. Pain primarily in the upper arm, occasionally radiates. Initially unable to lift arm, now improved. Took Advil. Occasional difficulty lifting arm and slight weakness. Relief with massage, improved flexibility with repeated reaching. Considering physical therapy. Avoids activities like using an elliptical machine due to pain. Wanted to get it checked out since he is still having pain and decreased ROM after 2 months. Has a nodule on his finger by the cuticle line as well as deformity to the nail. This has been present for over a year. No pain. No injury or trauma to the area.   Objective Blood pressure 140/88, pulse 81, height 1.727 m (5' 8), weight 86 kg (189 lb 9.6 oz), SpO2 96%.Physical Exam- General: Appears well, no acute distress- Respiratory: Breathing comfortably- Cardiovascular: 2+ radial pulses bilaterally- Extremities: 5/5 strength bilateral upper extremities- Musculoskeletal: No tenderness with palpation to right shoulder joint  - Discomfort with ROM overhead  extension, cross body, and reaching behind the back  - Mild tenderness with palpation to right deltoidResults  Assessment & Plan1. Annual Medicare wellness visit:- Bring healthcare proxy form next visit for scanning- Encourage influenza and COVID-19 vaccines at convenience2. Right shoulder pain:- Possible rotator cuff issue, tendinopathy, or strain- No tenderness with palpation to right shoulder joint- Discomfort with ROM overhead extension, cross body, and reaching behind the back- Mild tenderness with palpation to right deltoid- Recommend physical therapy to strengthen, improve flexibility, and enhance mobility- Referral to [facility] at [facility] per patient request- If no improvement in 4-6 weeks, consider orthopedics referral3. Finger nodule:- Potentially a cyst affecting nail growth- E-consult with dermatology to assess nodule and nail conditionFollow up scheduled appt in feb 2026.Author: Barabara Dee, NP  Note signed: 11/05/2023 [1] Current Outpatient Medications on File Prior to Visit Medication Sig Dispense Refill  colchicine -probenecid  (COLBENEMID) 0.5-500 MG per tablet TAKE 2 TABLETS BY MOUTH AS ONE DOSE THEN TAKE 1 TABLET TWO HOURS LATER 60 tablet 2  allopurinol  (ZYLOPRIM ) 300 mg tablet TAKE 1 TABLET BY MOUTH EVERY DAY 90 tablet 3  irbesartan  (AVAPRO ) 300 MG tablet Take 1 tablet (300 mg total) by mouth daily. 90 tablet 3 No current facility-administered medications on file prior to visit. [2] AllergiesAllergen Reactions  No Known Drug Allergy    Created by Conversion - 0;  [3] No past medical history on file.[4] Past Surgical History:Procedure Laterality Date  HX TONSILLECTOMY/ADENOIDECTOMY    Tonsillectomy Conversion Data   VASECTOMY    Surgery Of Male Genitalia Vasectomy Conversion Data  [5] No family history on file.[6] Social HistorySocioeconomic  History  Marital status: Married Tobacco Use  Smoking status: Never  Smokeless tobacco: Never Substance and Sexual Activity  Alcohol use: Yes   Alcohol/week: 4.0 standard drinks of alcohol   Types: 2 Cans of beer, 2 Glasses of wine per week   Comment: weekly  Drug use: No

## 2023-11-05 NOTE — Patient Instructions (Signed)
 Thank you for completing your Subsequent Annual Medicare Visit and Shoulder Pain with us  today. The purpose of this visits was un:Drmzzw for diseaseAssess risk of future medical problemsHelp develop a healthy lifestyleUpdate vaccinesGet to know your doctor in case of an illnessPatient Care Team:Vukman, Tanda NOVAK, MD as PCP - Ole Newman POUR, MD (Gastroenterology) Medicare 5 Year PlanThe following items were identified as areas of concern during your screening today:High Blood Pressure (Hypertension) - This is a risk factor for Heart Attack, Stroke, Kidney Problems and Eye Problems. BMI greater than 25 - This is a risk for Heart Attack, Stroke, High Blood Pressure, Diabetes, High Cholesterol and other complications. The Health Maintenance table below identifies screening tests and immunizations recommended by your health care team:Health Maintenance: These screening recommendations are based on [place], [employer], and [place] state guidelines Topic Date Due . MMR Vaccine (1 of 1 - Standard series) Never done . HIV Screening  Never done . Pneumococcal Vaccination (1 of 1 - PCV) Never done . Flu Shot (1) 10/05/2023 . COVID-19 Vaccine (7 - 2024-25 season) 10/05/2023 . Depression - Yearly  11/04/2024 . Fall Risk Screening  11/04/2024 . Colon Cancer Screening - Other  02/18/2027 . DTaP/Tdap/Td Vaccines (3 - Td or Tdap) 12/21/2029 . Shingles Vaccine  Completed . Hepatitis C Screening  Completed . Hepatitis B Vaccine  Aged Out . HIB Vaccine  Aged Out . HPV Vaccine  Aged Out . Meningococcal Vaccine  Aged Out . Rotavirus Vaccine  Aged Out . Meningitis Vaccine  Aged Out In addition, goals and orders placed to address these recommendations are listed in the Today's Visit section.We wish you the best of health and look forward to seeing you again next year for your Annual Medicare Wellness Visit. If you have any health care  concerns before then, please do not hesitate to contact us .

## 2023-11-08 ENCOUNTER — Other Ambulatory Visit: Payer: Medicare (Managed Care) | Admitting: Dermatology

## 2023-11-08 DIAGNOSIS — M67441 Ganglion, right hand: Secondary | ICD-10-CM

## 2023-11-08 DIAGNOSIS — M67449 Ganglion, unspecified hand: Secondary | ICD-10-CM

## 2023-11-08 NOTE — Provider Consult (Signed)
 Consulting Provider Response Dermatology eConsultQuestion/HistoryI am requesting an e-Consult for my patient, Kenneth Proctor, a 63 y.o. year old male with nodule to right pointer finger. MY CLINICAL QUESTION:  Nodule on right pointer finger with nail deformity. Non-painful, no trauma or injury. Present for over a year. Would like diagnosis, treatment or if patient needs to be seen in person. Location: on right pointer finger (part of body) First noticed: 1.5-2 years ago Changes since include: none (itching, bleeding, pain, other?) History of skin cancer: No History of immunosuppression: No PhotographsReviewed Photo Quality: AdequatePlease visit the following website to see a 1-page guide for taking medical [email or web address] mucous cystPlanThese are benign cysts that contain synovial fluid.  They may or may not connect to the nearest joint and can cause a nail groove.  If she wishes surgical removal of the lesion, hand surgery is the service line that excises digital mucous cysts. - An in-person Dermatology consultation is not necessary for this condition at this time.- If the patient has a new skin concern in the future, please submit a new eConsult. Time Spent: 5 minutesIf a patient is contacted to schedule an appointment, they will initially be offered a date that is up to a year in the future. Our department very actively uses Waitlist and Fast Pass to help many patients re-schedule appointments as earlier times become available. We use our best discretion to ensure that our referred patients are seen in an appropriate time frame for their condition. Please do not contact the eConsult provider to request an expedited appointment.Please instruct the patient to update their MyChart account to be able to receive Fast Pass text messages. Instructions:  Click Menu, scroll to Account Settings, click Communication Preferences, click Appointments, click Advanced Settings. In the [facility] Offer section, check all, including: Email, Text Message, and "Automatically sign up my appointments for earlier offers." Click Save Changes.This eConsult is focused on the specific clinical question(s) asked by the referring clinician, is based on the clinical data available to me, the consulting physician at the time off the request, and is furnished without benefit of a comprehensive evaluation of physical examination of the patient by me. The guidance set forth in the eConsult note will need to be interpreted in light of any clinical issues not known to me or any changes in patient status that I may not be aware of at the time of filing this eConsult. If further consultation is necessary, an in-person visit with me or another member of our group is an option.Ivonne Minnie Seip, MDDermatology Attending

## 2023-11-09 ENCOUNTER — Other Ambulatory Visit: Payer: Self-pay | Admitting: Primary Care

## 2023-11-09 DIAGNOSIS — M67449 Ganglion, unspecified hand: Secondary | ICD-10-CM

## 2023-11-09 NOTE — Progress Notes (Signed)
 Ordering Provider Response Derm e-consultAssessment Digital mucous cyst  Plan These are benign cysts that contain synovial fluid.  They may or may not connect to the nearest joint and can cause a nail groove.  If she wishes surgical removal of the lesion, hand surgery is the service line that excises digital mucous cysts.   - An in-person Dermatology consultation is not necessary for this condition at this time.Recommendations relayed to patient via mychart. Barabara Dee, NP

## 2023-11-19 ENCOUNTER — Encounter: Payer: Self-pay | Admitting: Gastroenterology

## 2023-12-18 ENCOUNTER — Encounter: Payer: Self-pay | Admitting: Gastroenterology

## 2024-01-22 ENCOUNTER — Encounter: Payer: Self-pay | Admitting: Gastroenterology

## 2024-02-19 ENCOUNTER — Encounter: Payer: Self-pay | Admitting: Gastroenterology

## 2024-03-02 ENCOUNTER — Encounter: Payer: Self-pay | Admitting: Primary Care

## 2024-03-10 NOTE — Telephone Encounter (Signed)
 Last seen in office:10/2/2025Next scheduled office visit is:03/28/2024

## 2024-03-28 ENCOUNTER — Encounter: Payer: Medicare (Managed Care) | Admitting: Primary Care
